# Patient Record
Sex: Male | Born: 1965 | Race: Black or African American | Hispanic: No | Marital: Single | Smoking: Current every day smoker
Health system: Southern US, Community
[De-identification: ages and names within clinical notes are randomized; demographics above are authoritative.]

## PROBLEM LIST (undated history)

## (undated) ENCOUNTER — Ambulatory Visit (HOSPITAL_COMMUNITY): Admission: EM | Payer: Self-pay | Source: Home / Self Care

## (undated) DIAGNOSIS — H5440 Blindness, one eye, unspecified eye: Secondary | ICD-10-CM

## (undated) DIAGNOSIS — G459 Transient cerebral ischemic attack, unspecified: Secondary | ICD-10-CM

## (undated) DIAGNOSIS — M25519 Pain in unspecified shoulder: Secondary | ICD-10-CM

## (undated) DIAGNOSIS — G8929 Other chronic pain: Secondary | ICD-10-CM

## (undated) HISTORY — DX: Blindness, one eye, unspecified eye: H54.40

## (undated) HISTORY — PX: EYE SURGERY: SHX253

---

## 2012-04-10 ENCOUNTER — Ambulatory Visit (INDEPENDENT_AMBULATORY_CARE_PROVIDER_SITE_OTHER): Payer: PRIVATE HEALTH INSURANCE | Admitting: Family Medicine

## 2012-04-10 ENCOUNTER — Encounter: Payer: Self-pay | Admitting: Family Medicine

## 2012-04-10 VITALS — BP 120/90 | HR 74 | Resp 15 | Ht 77.5 in | Wt 218.0 lb

## 2012-04-10 DIAGNOSIS — M25519 Pain in unspecified shoulder: Secondary | ICD-10-CM

## 2012-04-10 DIAGNOSIS — F17200 Nicotine dependence, unspecified, uncomplicated: Secondary | ICD-10-CM

## 2012-04-10 DIAGNOSIS — Z720 Tobacco use: Secondary | ICD-10-CM

## 2012-04-10 DIAGNOSIS — G8929 Other chronic pain: Secondary | ICD-10-CM | POA: Insufficient documentation

## 2012-04-10 NOTE — Patient Instructions (Signed)
Schedule wellness exam/physical for 6 weeks Do not eat after midnight prior to exam, labs will be needed Bring in your form for work  Work on the smoking !!
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
## 2012-04-10 NOTE — Assessment & Plan Note (Signed)
Counseled on importance of cessation of tobacco use. He does not appear ready to quit. He declines flu shot

## 2012-05-22 ENCOUNTER — Ambulatory Visit (INDEPENDENT_AMBULATORY_CARE_PROVIDER_SITE_OTHER): Payer: PRIVATE HEALTH INSURANCE | Admitting: Family Medicine

## 2012-05-22 ENCOUNTER — Encounter: Payer: Self-pay | Admitting: Family Medicine

## 2012-05-22 VITALS — BP 112/82 | HR 84 | Resp 15 | Ht 77.0 in | Wt 225.0 lb

## 2012-05-22 DIAGNOSIS — G8929 Other chronic pain: Secondary | ICD-10-CM

## 2012-05-22 DIAGNOSIS — Z720 Tobacco use: Secondary | ICD-10-CM

## 2012-05-22 DIAGNOSIS — M25519 Pain in unspecified shoulder: Secondary | ICD-10-CM

## 2012-05-22 DIAGNOSIS — F17200 Nicotine dependence, unspecified, uncomplicated: Secondary | ICD-10-CM

## 2012-05-22 DIAGNOSIS — Z1211 Encounter for screening for malignant neoplasm of colon: Secondary | ICD-10-CM

## 2012-05-22 DIAGNOSIS — Z125 Encounter for screening for malignant neoplasm of prostate: Secondary | ICD-10-CM

## 2012-05-22 DIAGNOSIS — Z13220 Encounter for screening for lipoid disorders: Secondary | ICD-10-CM

## 2012-05-22 DIAGNOSIS — Z0000 Encounter for general adult medical examination without abnormal findings: Secondary | ICD-10-CM

## 2012-05-22 LAB — COMPREHENSIVE METABOLIC PANEL
Albumin: 4.5 g/dL (ref 3.5–5.2)
CO2: 24 mEq/L (ref 19–32)
Glucose, Bld: 90 mg/dL (ref 70–99)
Potassium: 4.5 mEq/L (ref 3.5–5.3)
Sodium: 141 mEq/L (ref 135–145)
Total Bilirubin: 0.6 mg/dL (ref 0.3–1.2)
Total Protein: 7.3 g/dL (ref 6.0–8.3)

## 2012-05-22 LAB — CBC
Platelets: 245 10*3/uL (ref 150–400)
RBC: 5.16 MIL/uL (ref 4.22–5.81)
WBC: 4.5 10*3/uL (ref 4.0–10.5)

## 2012-05-22 LAB — LIPID PANEL
Cholesterol: 156 mg/dL (ref 0–200)
HDL: 49 mg/dL (ref >39)
Total CHOL/HDL Ratio: 3.2 Ratio
Triglycerides: 152 mg/dL — ABNORMAL HIGH (ref <150)

## 2012-05-22 MED ORDER — TRAMADOL HCL 50 MG PO TABS: 50.0000 mg | ORAL_TABLET | Freq: Four times a day (QID) | ORAL | Status: AC | PRN

## 2012-05-22 MED ORDER — IBUPROFEN 800 MG PO TABS: 800.0000 mg | ORAL_TABLET | Freq: Three times a day (TID) | ORAL | Status: DC | PRN

## 2012-05-22 NOTE — Assessment & Plan Note (Signed)
Contemplating cessation. 

## 2012-05-22 NOTE — Patient Instructions (Signed)
I recommend eye visit once a year I recommend dental visit every 6 months Goal is to  Exercise 30 minutes 5 days a week We will send a letter with lab results  I recommend you quit smoking! F/U 1 year or as needed

## 2012-05-22 NOTE — Progress Notes (Signed)
  Subjective:    Patient ID: Julian Rogers, male    DOB: [birth year], 46 y.o.   MRN: [id number]  HPI Patient here for complete physical. He's fasting to have labs done. He continues to have his chronic shoulder pain on and off and is asking for prescription for ibuprofen. No new concerns. Declines immunizations.   Review of Systems  GEN- denies fatigue, fever, weight loss,weakness, recent illness HEENT- denies eye drainage, change in vision, nasal discharge, CVS- denies chest pain, palpitations RESP- denies SOB, cough, wheeze ABD- denies N/V, change in stools, abd pain GU- denies dysuria, hematuria, dribbling, incontinence MSK-+ joint pain, muscle aches, injury Neuro- denies headache, dizziness, syncope, seizure activity      Objective:   Physical Exam GEN- NAD, alert and oriented x3 HEENT- Blind right eye, EOMI, non injected sclera, pink conjunctiva, MMM, oropharynx clear, TM clear bilat Neck- Supple,  CVS- RRR, no murmur RESP-CTAB ABS-NABS,soft,NT,ND GU- prostate smooth, no nodules, FOBT neg, normal rectal tone EXT- No edema Pulses- Radial, DP- 2+ MSK- Decreased ROM left shoulder, +empty can, biceps in tact bilat        Assessment & Plan:   CPE- physical done, work form completed. Declines flu shot   Fasting labs to be done, discussed PSA screening, pt agrees

## 2012-05-22 NOTE — Addendum Note (Signed)
Addended by: Abner Greenspan on: 05/22/2012 04:06 PM   Modules accepted: Orders

## 2012-05-22 NOTE — Assessment & Plan Note (Signed)
Ibuprofen 800mg  given, ultram prn severe pain

## 2014-02-07 ENCOUNTER — Encounter (HOSPITAL_COMMUNITY): Payer: Self-pay | Admitting: Emergency Medicine

## 2014-02-07 ENCOUNTER — Emergency Department (HOSPITAL_COMMUNITY)
Admission: EM | Admit: 2014-02-07 | Discharge: 2014-02-07 | Disposition: A | Discharge status: Home / Self Care | Payer: Self-pay | Attending: Emergency Medicine | Admitting: Emergency Medicine

## 2014-02-07 ENCOUNTER — Emergency Department (HOSPITAL_COMMUNITY): Payer: Self-pay

## 2014-02-07 DIAGNOSIS — R209 Unspecified disturbances of skin sensation: Secondary | ICD-10-CM | POA: Insufficient documentation

## 2014-02-07 DIAGNOSIS — Z880 Allergy status to penicillin: Secondary | ICD-10-CM | POA: Insufficient documentation

## 2014-02-07 DIAGNOSIS — F17200 Nicotine dependence, unspecified, uncomplicated: Secondary | ICD-10-CM | POA: Insufficient documentation

## 2014-02-07 DIAGNOSIS — Z791 Long term (current) use of non-steroidal anti-inflammatories (NSAID): Secondary | ICD-10-CM | POA: Insufficient documentation

## 2014-02-07 DIAGNOSIS — Z8669 Personal history of other diseases of the nervous system and sense organs: Secondary | ICD-10-CM | POA: Insufficient documentation

## 2014-02-07 DIAGNOSIS — M5412 Radiculopathy, cervical region: Secondary | ICD-10-CM | POA: Insufficient documentation

## 2014-02-07 DIAGNOSIS — M25519 Pain in unspecified shoulder: Secondary | ICD-10-CM | POA: Insufficient documentation

## 2014-02-07 HISTORY — DX: Other chronic pain: G89.29

## 2014-02-07 HISTORY — DX: Pain in unspecified shoulder: M25.519

## 2014-02-07 MED ORDER — HYDROCODONE-ACETAMINOPHEN 5-325 MG PO TABS: 1.0000 | ORAL_TABLET | ORAL | Status: DC | PRN

## 2014-02-07 MED ORDER — PREDNISONE 50 MG PO TABS
60.0000 mg | ORAL_TABLET | Freq: Once | ORAL | Status: AC
  Administered 2014-02-07: 60 mg via ORAL
  Filled 2014-02-07 (×2): qty 1

## 2014-02-07 MED ORDER — NAPROXEN 500 MG PO TABS: 500.0000 mg | ORAL_TABLET | Freq: Two times a day (BID) | ORAL | Status: DC

## 2014-02-07 MED ORDER — HYDROCODONE-ACETAMINOPHEN 5-325 MG PO TABS
1.0000 | ORAL_TABLET | Freq: Once | ORAL | Status: AC
  Administered 2014-02-07: 1 via ORAL
  Filled 2014-02-07: qty 1

## 2014-02-07 MED ORDER — METHYLPREDNISOLONE 4 MG PO KIT: PACK | ORAL | Status: DC

## 2014-02-07 NOTE — ED Notes (Signed)
C/o pain to right arm with numbness, rates pain 2 at this time.  Started new job last week and using right arm moore than normal.  Took ibuprofen 800 mg at 0900 this am.
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
## 2014-02-07 NOTE — ED Notes (Addendum)
Patient reports swelling and numbness in right arm that started yesterday. Patient reports feeling "lightheaded" today. Patient reports weakness in right arm. Denies any headaches, slurred speech, or facial drooping.

## 2014-02-07 NOTE — ED Notes (Signed)
Pt had MRI prior to discharge per doctor order.  MRI reviewed and discharge orders given and reviewed with patient prior to leaving ED. Both patient and wife stated understanding of discharge instructions and prescription.

## 2014-02-07 NOTE — Discharge Instructions (Signed)

## 2014-02-07 NOTE — ED Notes (Addendum)
Dr. James at bedside  

## 2014-02-21 ENCOUNTER — Other Ambulatory Visit: Payer: Self-pay | Admitting: Family

## 2014-12-01 ENCOUNTER — Emergency Department (HOSPITAL_COMMUNITY)
Admission: EM | Admit: 2014-12-01 | Discharge: 2014-12-02 | Disposition: A | Discharge status: Home / Self Care | Payer: BLUE CROSS/BLUE SHIELD | Attending: Emergency Medicine | Admitting: Emergency Medicine

## 2014-12-01 ENCOUNTER — Encounter (HOSPITAL_COMMUNITY): Payer: Self-pay

## 2014-12-01 ENCOUNTER — Emergency Department (HOSPITAL_COMMUNITY): Payer: BLUE CROSS/BLUE SHIELD

## 2014-12-01 DIAGNOSIS — Z791 Long term (current) use of non-steroidal anti-inflammatories (NSAID): Secondary | ICD-10-CM | POA: Diagnosis not present

## 2014-12-01 DIAGNOSIS — Y998 Other external cause status: Secondary | ICD-10-CM | POA: Insufficient documentation

## 2014-12-01 DIAGNOSIS — Y9389 Activity, other specified: Secondary | ICD-10-CM | POA: Diagnosis not present

## 2014-12-01 DIAGNOSIS — Z79899 Other long term (current) drug therapy: Secondary | ICD-10-CM | POA: Insufficient documentation

## 2014-12-01 DIAGNOSIS — Z720 Tobacco use: Secondary | ICD-10-CM | POA: Diagnosis not present

## 2014-12-01 DIAGNOSIS — S3991XA Unspecified injury of abdomen, initial encounter: Secondary | ICD-10-CM | POA: Diagnosis not present

## 2014-12-01 DIAGNOSIS — S299XXA Unspecified injury of thorax, initial encounter: Secondary | ICD-10-CM | POA: Diagnosis not present

## 2014-12-01 DIAGNOSIS — Y9289 Other specified places as the place of occurrence of the external cause: Secondary | ICD-10-CM | POA: Diagnosis not present

## 2014-12-01 DIAGNOSIS — W228XXA Striking against or struck by other objects, initial encounter: Secondary | ICD-10-CM | POA: Insufficient documentation

## 2014-12-01 DIAGNOSIS — H5441 Blindness, right eye, normal vision left eye: Secondary | ICD-10-CM | POA: Diagnosis not present

## 2014-12-01 DIAGNOSIS — G8929 Other chronic pain: Secondary | ICD-10-CM | POA: Diagnosis not present

## 2014-12-01 DIAGNOSIS — T1490XA Injury, unspecified, initial encounter: Secondary | ICD-10-CM

## 2014-12-01 DIAGNOSIS — Z880 Allergy status to penicillin: Secondary | ICD-10-CM | POA: Insufficient documentation

## 2014-12-01 LAB — CBC WITH DIFFERENTIAL/PLATELET
BASOS ABS: 0 10*3/uL (ref 0.0–0.1)
BASOS PCT: 0 % (ref 0–1)
EOS ABS: 0.5 10*3/uL (ref 0.0–0.7)
Eosinophils Relative: 9 % — ABNORMAL HIGH (ref 0–5)
HEMATOCRIT: 43.5 % (ref 39.0–52.0)
Hemoglobin: 14.8 g/dL (ref 13.0–17.0)
LYMPHS ABS: 2.5 10*3/uL (ref 0.7–4.0)
LYMPHS PCT: 41 % (ref 12–46)
MCH: 29.5 pg (ref 26.0–34.0)
MCHC: 34 g/dL (ref 30.0–36.0)
MCV: 86.7 fL (ref 78.0–100.0)
Monocytes Absolute: 0.4 10*3/uL (ref 0.1–1.0)
Monocytes Relative: 7 % (ref 3–12)
Neutro Abs: 2.7 10*3/uL (ref 1.7–7.7)
Neutrophils Relative %: 43 % (ref 43–77)
Platelets: 271 10*3/uL (ref 150–400)
RBC: 5.02 MIL/uL (ref 4.22–5.81)
RDW: 14 % (ref 11.5–15.5)
WBC: 6.1 10*3/uL (ref 4.0–10.5)

## 2014-12-01 LAB — BASIC METABOLIC PANEL
ANION GAP: 12 (ref 5–15)
BUN: 11 mg/dL (ref 6–20)
CALCIUM: 9.2 mg/dL (ref 8.9–10.3)
CO2: 24 mmol/L (ref 22–32)
CREATININE: 1.2 mg/dL (ref 0.61–1.24)
Chloride: 100 mmol/L — ABNORMAL LOW (ref 101–111)
GFR calc Af Amer: >60 mL/min (ref >60)
GFR calc non Af Amer: >60 mL/min (ref >60)
Glucose, Bld: 95 mg/dL (ref 65–99)
Potassium: 3.2 mmol/L — ABNORMAL LOW (ref 3.5–5.1)
SODIUM: 136 mmol/L (ref 135–145)

## 2014-12-01 MED ORDER — IOHEXOL 300 MG/ML  SOLN
100.0000 mL | Freq: Once | INTRAMUSCULAR | Status: AC | PRN
  Administered 2014-12-01: 100 mL via INTRAVENOUS

## 2014-12-01 NOTE — ED Notes (Signed)
Pt on cell phone
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
## 2014-12-02 MED ORDER — DICLOFENAC SODIUM 50 MG PO TBEC: 50.0000 mg | DELAYED_RELEASE_TABLET | Freq: Two times a day (BID) | ORAL | Status: DC

## 2014-12-02 NOTE — Discharge Instructions (Signed)
Blunt Chest Trauma °Blunt chest trauma is an injury caused by a blow to the chest. These chest injuries can be very painful. Blunt chest trauma often results in bruised or broken (fractured) ribs. Most cases of bruised and fractured ribs from blunt chest traumas get better after 1 to 3 weeks of rest and pain medicine. Often, the soft tissue in the chest wall is also injured, causing pain and bruising. Internal organs, such as the heart and lungs, may also be injured. Blunt chest trauma can lead to serious medical problems. This injury requires immediate medical care. °CAUSES  °· Motor vehicle collisions. °· Falls. °· Physical violence. °· Sports injuries. °SYMPTOMS  °· Chest pain. The pain may be worse when you move or breathe deeply. °· Shortness of breath. °· Lightheadedness. °· Bruising. °· Tenderness. °· Swelling. °DIAGNOSIS  °Your caregiver will do a physical exam. X-rays may be taken to look for fractures. However, minor rib fractures may not show up on X-rays until a few days after the injury. If a more serious injury is suspected, further imaging tests may be done. This may include ultrasounds, computed tomography (CT) scans, or magnetic resonance imaging (MRI). °TREATMENT  °Treatment depends on the severity of your injury. Your caregiver may prescribe pain medicines and deep breathing exercises. °HOME CARE INSTRUCTIONS °· Limit your activities until you can move around without much pain. °· Do not do any strenuous work until your injury is healed. °· Put ice on the injured area. °¨ Put ice in a plastic bag. °¨ Place a towel between your skin and the bag. °¨ Leave the ice on for 15-20 minutes, 03-04 times a day. °· You may wear a rib belt as directed by your caregiver to reduce pain. °· Practice deep breathing as directed by your caregiver to keep your lungs clear. °· Only take over-the-counter or prescription medicines for pain, fever, or discomfort as directed by your caregiver. °SEEK IMMEDIATE MEDICAL  CARE IF:  °· You have increasing pain or shortness of breath. °· You cough up blood. °· You have nausea, vomiting, or abdominal pain. °· You have a fever. °· You feel dizzy, weak, or you faint. °MAKE SURE YOU: °· Understand these instructions. °· Will watch your condition. °· Will get help right away if you are not doing well or get worse. °Document Released: 08/01/2004 Document Revised: 09/16/2011 Document Reviewed: 04/10/2011 °[employer]® Patient Information ©2015 [employer]. This information is not intended to replace advice given to you by your health care provider. Make sure you discuss any questions you have with your health care provider. ° ° °

## 2016-01-23 ENCOUNTER — Emergency Department (HOSPITAL_COMMUNITY)
Admission: EM | Admit: 2016-01-23 | Discharge: 2016-01-23 | Disposition: A | Discharge status: Home / Self Care | Payer: BLUE CROSS/BLUE SHIELD | Attending: Emergency Medicine | Admitting: Emergency Medicine

## 2016-01-23 ENCOUNTER — Encounter (HOSPITAL_COMMUNITY): Payer: Self-pay

## 2016-01-23 ENCOUNTER — Emergency Department (HOSPITAL_COMMUNITY): Payer: BLUE CROSS/BLUE SHIELD

## 2016-01-23 DIAGNOSIS — F17210 Nicotine dependence, cigarettes, uncomplicated: Secondary | ICD-10-CM | POA: Diagnosis not present

## 2016-01-23 DIAGNOSIS — Z79899 Other long term (current) drug therapy: Secondary | ICD-10-CM | POA: Insufficient documentation

## 2016-01-23 DIAGNOSIS — W19XXXA Unspecified fall, initial encounter: Secondary | ICD-10-CM

## 2016-01-23 DIAGNOSIS — G8929 Other chronic pain: Secondary | ICD-10-CM | POA: Diagnosis not present

## 2016-01-23 DIAGNOSIS — M5412 Radiculopathy, cervical region: Secondary | ICD-10-CM | POA: Diagnosis not present

## 2016-01-23 DIAGNOSIS — M542 Cervicalgia: Secondary | ICD-10-CM

## 2016-01-23 DIAGNOSIS — M546 Pain in thoracic spine: Secondary | ICD-10-CM | POA: Diagnosis present

## 2016-01-23 MED ORDER — CYCLOBENZAPRINE HCL 10 MG PO TABS: 10.0000 mg | ORAL_TABLET | Freq: Three times a day (TID) | ORAL | Status: DC | PRN

## 2016-01-23 MED ORDER — HYDROCODONE-ACETAMINOPHEN 5-325 MG PO TABS
1.0000 | ORAL_TABLET | ORAL | Status: DC | PRN
Start: 2016-01-23 — End: 2021-03-01

## 2016-01-23 MED ORDER — METHYLPREDNISOLONE 4 MG PO TBPK: ORAL_TABLET | ORAL | Status: DC

## 2016-01-23 MED ORDER — OXYCODONE-ACETAMINOPHEN 5-325 MG PO TABS
2.0000 | ORAL_TABLET | Freq: Once | ORAL | Status: AC
  Administered 2016-01-23: 2 via ORAL
  Filled 2016-01-23: qty 2

## 2016-01-23 MED ORDER — KETOROLAC TROMETHAMINE 60 MG/2ML IM SOLN
60.0000 mg | Freq: Once | INTRAMUSCULAR | Status: AC
  Administered 2016-01-23: 60 mg via INTRAMUSCULAR
  Filled 2016-01-23: qty 2

## 2016-01-23 MED ORDER — NAPROXEN 500 MG PO TABS
500.0000 mg | ORAL_TABLET | Freq: Two times a day (BID) | ORAL | Status: DC
Start: 2016-01-23 — End: 2021-03-01

## 2016-01-23 NOTE — ED Notes (Signed)
Fell onto back last week , no LOC no hitting of head C/O upper back pain

## 2016-01-23 NOTE — ED Notes (Signed)
Pt indicated pain to mid back from a fall he had a few days ago. Denies other symptoms

## 2016-01-23 NOTE — ED Provider Notes (Signed)
CSN: [id number]     Arrival date & time 01/23/16  1157 History   First MD Initiated Contact with Patient 01/23/16 1227     Chief Complaint  Patient presents with  . Back Pain     (Consider location/radiation/quality/duration/timing/severity/associated sxs/prior Treatment) Patient is a 50 y.o. male presenting with back pain.  Back Pain Location:  Thoracic spine Quality:  Aching and stiffness Stiffness is present:  All day Radiates to:  Does not radiate Pain severity:  Severe Onset quality:  Sudden Duration:  2 days Timing:  Constant Progression:  Worsening Chronicity:  New Context comment:  Patient tripped on a log and fell backwards, striking his neck and upper spine Relieved by:  Nothing Worsened by:  Movement, standing and palpation Ineffective treatments:  None tried Associated symptoms: no abdominal pain, no bladder incontinence, no bowel incontinence, no chest pain, no dysuria, no fever, no numbness, no paresthesias, no perianal numbness and no weakness     Past Medical History  Diagnosis Date  . 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 Chronic shoulder pain    Past Surgical History  Procedure Laterality Date  . Eye surgery      right eye at birth    Family History  Problem Relation Age of Onset  . Cancer Mother    Social History  Substance Use Topics  . Smoking status: Current Every Day Smoker -- 0.50 packs/day for 25 years    Types: Cigarettes  . Smokeless tobacco: Never Used  . Alcohol Use: Yes     Comment: 3-40 ounces    Review of Systems  Constitutional: Negative for fever.  HENT: Negative for congestion and rhinorrhea.   Eyes: Negative for visual disturbance.  Respiratory: Negative for cough and shortness of breath.   Cardiovascular: Negative for chest pain and leg swelling.  Gastrointestinal: Negative for nausea, vomiting, abdominal pain, diarrhea and bowel incontinence.  Genitourinary: Negative for bladder incontinence,  dysuria and flank pain.  Musculoskeletal: Positive for back pain. Negative for neck stiffness.  Skin: Negative for rash.  Allergic/Immunologic: Negative for immunocompromised state.  Neurological: Negative for syncope, weakness, numbness and paresthesias.      Allergies  Penicillins  Home Medications   Prior to Admission medications   Medication Sig Start Date End Date Taking? Authorizing Provider  diclofenac (VOLTAREN) 50 MG EC tablet Take 1 tablet (50 mg total) by mouth 2 (two) times daily. 12/02/14  Yes Gilda Crease, MD  HYDROcodone-acetaminophen (NORCO/VICODIN) 5-325 MG per tablet Take 1 tablet by mouth every 4 (four) hours as needed. 02/07/14  Yes Rolland Porter, MD  methylPREDNISolone (MEDROL DOSEPAK) 4 MG tablet 6 po on day 1, decrease by 1 tab per day 02/07/14  Yes Rolland Porter, MD  naproxen (NAPROSYN) 500 MG tablet Take 1 tablet (500 mg total) by mouth 2 (two) times daily. 02/07/14  Yes Rolland Porter, MD   BP 130/75 mmHg  Pulse 79  Temp(Src) 98.2 F (36.8 C) (Oral)  Resp 18  Ht  (1.956 m)  Wt 210 lb (95.255 kg)  BMI 24.90 kg/m2  SpO2 99% Physical Exam  Constitutional: He appears well-developed and well-nourished. No distress.  HENT:  Head: Normocephalic.  Mouth/Throat: No oropharyngeal exudate.  Eyes: Conjunctivae are normal. Pupils are equal, round, and reactive to light.  Neck: Normal range of motion. Neck supple.  Cardiovascular: Normal rate, regular rhythm, normal heart sounds and intact distal pulses.  Exam reveals no friction rub.   No  murmur heard. Pulmonary/Chest: Effort normal and breath sounds normal. No respiratory distress. He has no wheezes. He has no rales.  Abdominal: Soft. He exhibits no distension. There is no tenderness.  Musculoskeletal: He exhibits no edema.  Neurological: He is alert.  Skin: Skin is warm. No rash noted.  Nursing note and vitals reviewed.   Spine: Moderate TTP over midline and paraspinal lower cervical and thoracic spine,  with no swelling Strength 5/5 with hand flexion, extension, intrinsic hands muscles, wrist flexion/extension, elbow flexion/extension, and shoulder abduction/adduction/flexion/extension bilaterally Normal, intact sensation to pinprick and light touch in bilateral distal and proximal UE Strength 5/5 throughout LE and sensation intact LE bilaterally Normal gait Normal brachial and brachioradialis reflexes  ED Course  Procedures (including critical care time) Labs Review Labs Reviewed - No data to display  Imaging Review Ct Cervical Spine Wo Contrast  01/23/2016  CLINICAL DATA:  Fall 5 days ago, posterior and upper back pain, bilateral arm tingling. EXAM: CT CERVICAL SPINE WITHOUT CONTRAST TECHNIQUE: Multidetector CT imaging of the cervical spine was performed without intravenous contrast. Multiplanar CT image reconstructions were also generated. COMPARISON:  MRI of the cervical spine February 07, 2014 FINDINGS: ALIGNMENT: Cervical vertebral bodies and alignment. Maintenance of cervical lordosis. OSSEOUS STRUCTURES: Cervical vertebral bodies and posterior elements are intact. Moderate C3-4, mild C4-5 and C6-7 disc height loss associated with uncovertebral hypertrophy and endplate sclerosis consistent with degenerative discs. C1-2 articulation maintained. No destructive bony lesions. Severe LEFT C3-4, moderate to severe bilateral C4-5 and LEFT C5-6 neural foraminal narrowing. Moderate to severe bilateral C6-7 neural foraminal narrowing. SOFT TISSUES: Included prevertebral and paraspinal soft tissues are normal. Mild biapical bullous changes. IMPRESSION: No acute fracture or malalignment. 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 Electronically Signed   By: Awilda Metroourtnay  Bloomer M.D.   On: 01/23/2016 14:33   I have personally reviewed and evaluated these images and lab results as part of my medical decision-making.   EKG Interpretation None       MDM  50 yo AAM with PMHx of cervical radiculopathy who p/w upper thoracic and lower cervical spine pain s/p fall from standing. Pt did c/o intermittent shooting, neuropathic pains in bilateral RUE>LUE but admits these are acute on chronic with no new distribution, weakness, or numbness. CT C-spine and T-Spine show normal alignment and no acute fx, though pt does have significant degenerative and foraminal degenerative disease. No signs of acute neurological compromise in b/l UE and LE at this time. No red flags or signs of cauda equina. Pt o/w well appearing and ambulatory w/o difficulty. No hand weakness, numbness, or signs of central cord syndrome. Will advise NSAIDs, muscle relaxants, and give short course of steroids with PCP and Spine f/u as outpt. Pt in agreement. Return precautions given.  Clinical Impression: 1. Chronic neck pain   2. Chronic radicular cervical pain   3. Fall, initial encounter     Disposition: Discharge  Condition: Good  I have discussed the results, Dx and Tx plan with the pt(& family if present). He/she/they expressed understanding and agree(s) with the plan. Discharge instructions discussed at great length. Strict return precautions discussed and pt &/or family have verbalized understanding of the instructions. No further questions at time of discharge.    Discharge Medication List as of 01/23/2016  2:56 PM    START taking these medications   Details  cyclobenzaprine (FLEXERIL) 10 MG tablet Take 1 tablet (10 mg total) by mouth 3 (three) times  daily as needed for muscle spasms., Starting 01/23/2016, Until Discontinued, Print    methylPREDNISolone (MEDROL DOSEPAK) 4 MG TBPK tablet Take as outlined on packet, Print        Follow Up: Salley Scarlet, MD [address] [place] [address] [phone]   As needed, If symptoms worsen      Shaune Pollack, MD 01/23/16 [phone]

## 2016-01-23 NOTE — Discharge Instructions (Signed)

## 2016-01-26 ENCOUNTER — Encounter (HOSPITAL_COMMUNITY): Payer: Self-pay | Admitting: *Deleted

## 2016-01-26 ENCOUNTER — Emergency Department (HOSPITAL_COMMUNITY)
Admission: EM | Admit: 2016-01-26 | Discharge: 2016-01-26 | Disposition: A | Discharge status: Home / Self Care | Payer: BLUE CROSS/BLUE SHIELD | Attending: Emergency Medicine | Admitting: Emergency Medicine

## 2016-01-26 ENCOUNTER — Ambulatory Visit: Payer: Self-pay | Admitting: Family Medicine

## 2016-01-26 DIAGNOSIS — Z79891 Long term (current) use of opiate analgesic: Secondary | ICD-10-CM | POA: Diagnosis not present

## 2016-01-26 DIAGNOSIS — Z79899 Other long term (current) drug therapy: Secondary | ICD-10-CM | POA: Diagnosis not present

## 2016-01-26 DIAGNOSIS — F17210 Nicotine dependence, cigarettes, uncomplicated: Secondary | ICD-10-CM | POA: Insufficient documentation

## 2016-01-26 DIAGNOSIS — M546 Pain in thoracic spine: Secondary | ICD-10-CM | POA: Diagnosis present

## 2016-01-26 NOTE — ED Notes (Signed)
Pt came on 01/23/16 for back pain. Pt was taken out of work until yesterday. Pt states when he went back to work his back began working again, states he does heavy lifting. Denies any new injury.

## 2016-01-26 NOTE — ED Provider Notes (Signed)
CSN: [id number]     Arrival date & time 01/26/16  [id number] History   First MD Initiated Contact with Patient 01/26/16 1046     Chief Complaint  Patient presents with  . Back Pain     (Consider location/radiation/quality/duration/timing/severity/associated sxs/prior Treatment) HPI Julian Rogers is a 50 y.o. male who presents to the ED with back pain. He was here 7/18 for mid back pain after he fell and hit his back. He went back to work yesterday and was there for an hour and send home due to the pain. He does heavy lifting at work. He denies any new injury. He denies loss of control of his bladder or bowels, n/v or abdominal pain. No shortness of breath or chest pain. He reports that his supervisor told him to come back to the ED for a work note until next week so he did not get an occurrence on his attendance. Patient reports that the medication is helping but he just hurts when he does the heavy lifting.   Past Medical History  Diagnosis Date  . 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 Chronic shoulder pain    Past Surgical History  Procedure Laterality Date  . Eye surgery      right eye at birth    Family History  Problem Relation Age of Onset  . Cancer Mother    Social History  Substance Use Topics  . Smoking status: Current Every Day Smoker -- 0.50 packs/day for 25 years    Types: Cigarettes  . Smokeless tobacco: Never Used  . Alcohol Use: Yes     Comment: 3-40 ounces    Review of Systems Negative except as stated in HPI   Allergies  Penicillins  Home Medications   Prior to Admission medications   Medication Sig Start Date End Date Taking? Authorizing Provider  cyclobenzaprine (FLEXERIL) 10 MG tablet Take 1 tablet (10 mg total) by mouth 3 (three) times daily as needed for muscle spasms. 01/23/16  Yes Shaune Pollackameron Isaacs, MD  diclofenac (VOLTAREN) 50 MG EC tablet Take 1 tablet (50 mg total) by mouth 2 (two) times daily. 12/02/14  Yes Gilda Creasehristopher J  Pollina, MD  HYDROcodone-acetaminophen (NORCO/VICODIN) 5-325 MG tablet Take 1 tablet by mouth every 4 (four) hours as needed for severe pain. 01/23/16  Yes Shaune Pollackameron Isaacs, MD  methylPREDNISolone (MEDROL DOSEPAK) 4 MG TBPK tablet Take as outlined on packet 01/23/16  Yes Shaune Pollackameron Isaacs, MD  naproxen (NAPROSYN) 500 MG tablet Take 1 tablet (500 mg total) by mouth 2 (two) times daily. 01/23/16  Yes Shaune Pollackameron Isaacs, MD   BP 117/78 mmHg  Pulse 67  Temp(Src) 98.3 F (36.8 C) (Oral)  Resp 16  Ht 6\' 5"  (1.956 m)  Wt 95.255 kg  BMI 24.90 kg/m2  SpO2 99% Physical Exam  Constitutional: He is oriented to person, place, and time. He appears well-developed and well-nourished. No distress.  HENT:  Head: Normocephalic and atraumatic.  Eyes: EOM are normal.  Neck: Neck supple.  Cardiovascular: Normal rate and regular rhythm.   Pulmonary/Chest: Effort normal.  Abdominal: He exhibits no distension.  Musculoskeletal: He exhibits no edema.       Thoracic back: He exhibits tenderness and spasm. He exhibits normal pulse. Decreased range of motion: due to pain.  Neurological: He is alert and oriented to person, place, and time. He has normal strength. No sensory deficit. Gait normal.  Reflex Scores:      Bicep reflexes are 2+  on the right side and 2+ on the left side.      Brachioradialis reflexes are 2+ on the right side and 2+ on the left side.      Patellar reflexes are 2+ on the right side and 2+ on the left side. Skin: Skin is warm and dry.  Psychiatric: He has a normal mood and affect. His behavior is normal.  Nursing note and vitals reviewed.   ED Course  Procedures (including critical care time) Labs Review Labs Reviewed - No data to display   MDM  50 y.o. male with continued back pain after fall 7/18 stable for d/c without focal neuro deficits and improving since the fall. He will continue his medication and f/u with his PCP as planned. He will return for worsening symptoms.  Work note given.    Final diagnoses:  Midline thoracic back pain       Janne Napoleon, NP 01/26/16 1216  Bethann Berkshire, MD 01/27/16 1320

## 2016-04-12 IMAGING — CT CT CHEST W/ CM
2 of 5 series · 14 of 36 positions shown, 17 images · IV contrast (Omnipaque 300)
Comparison: None.

CLINICAL DATA: Right chest injury 2 days ago after tired [place] hit
him in the chest. Abdominal pain. Initial encounter.

EXAM:
CT CHEST, ABDOMEN, AND PELVIS WITH CONTRAST
TECHNIQUE: Multidetector CT imaging of the chest, abdomen and pelvis was
performed following the standard protocol during bolus
administration of intravenous contrast.
CONTRAST:  100mL OMNIPAQUE IOHEXOL 300 MG/ML  SOLN

[Series 2: cap with 5.0 b40f · axial · 0.78mm/px · z∈[-628,-43]mm · 11 of 135 slices shown, 14 images]
[im 9/135  mediastinal]
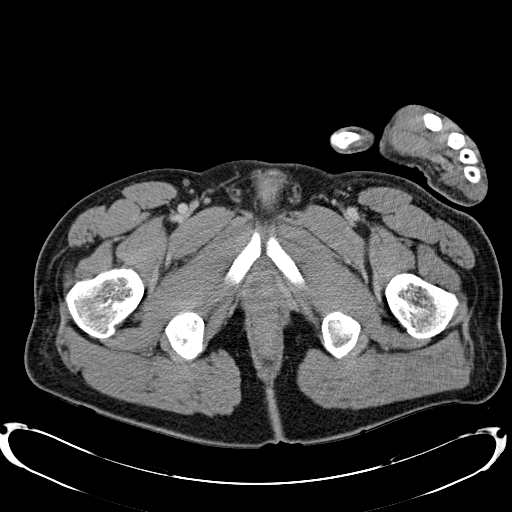
[im 9/135  lung]
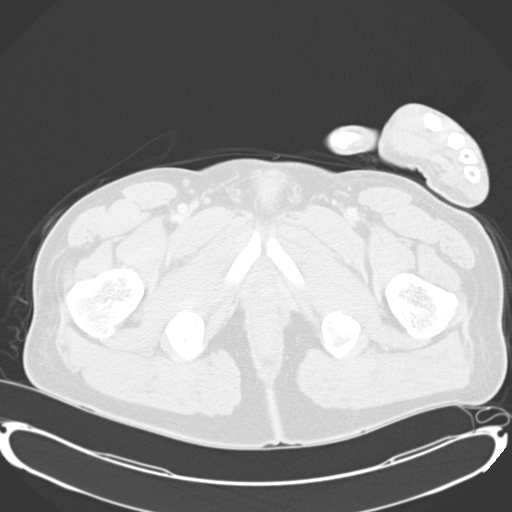
[im 18/135  lung]
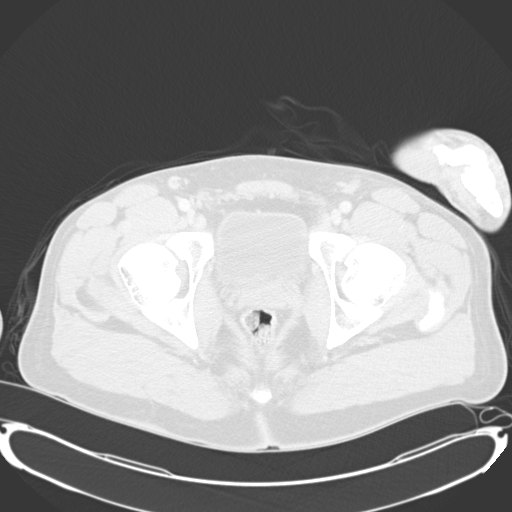
[im 36/135  lung]
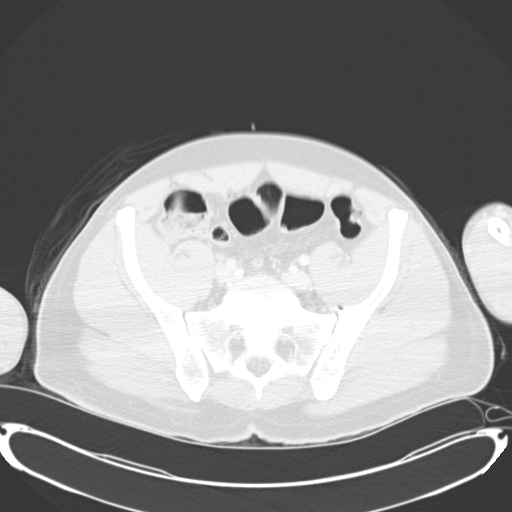
[im 45/135  lung]
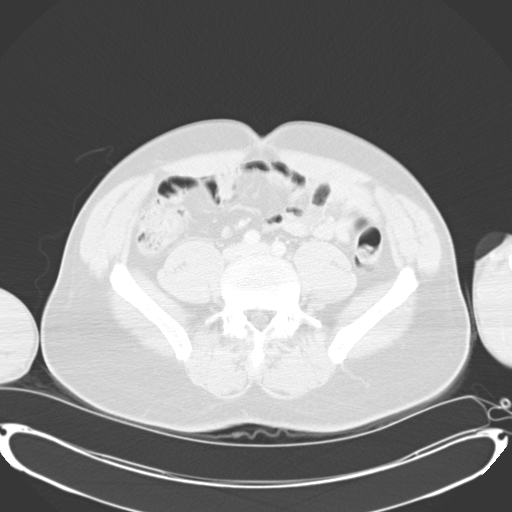
[im 54/135  mediastinal]
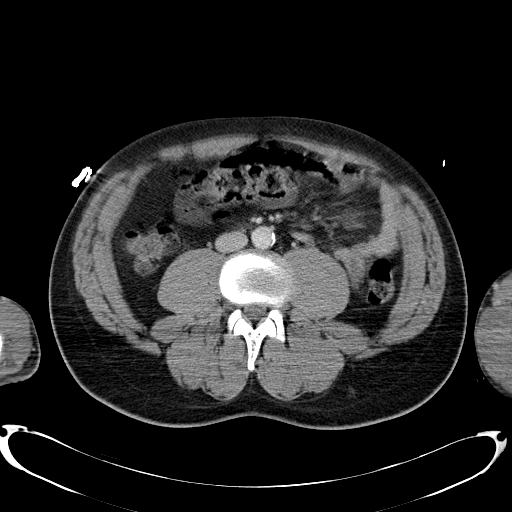
[im 54/135  lung]
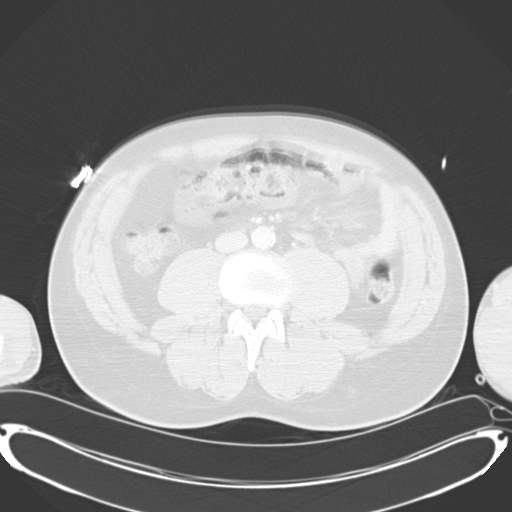
[im 72/135  lung]
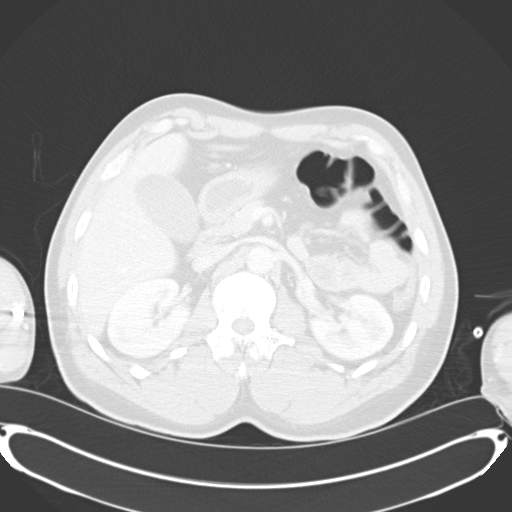
[im 81/135  lung]
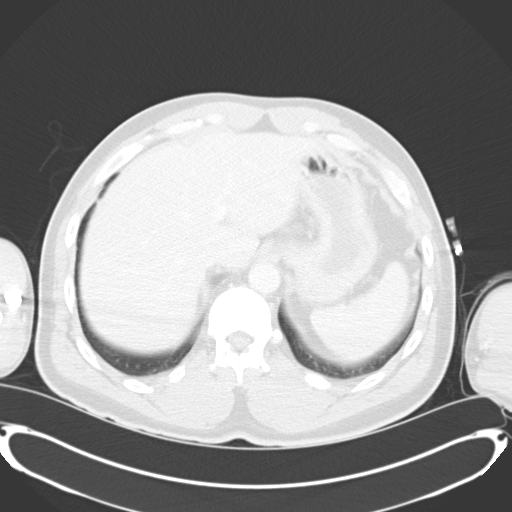
[im 90/135  lung]
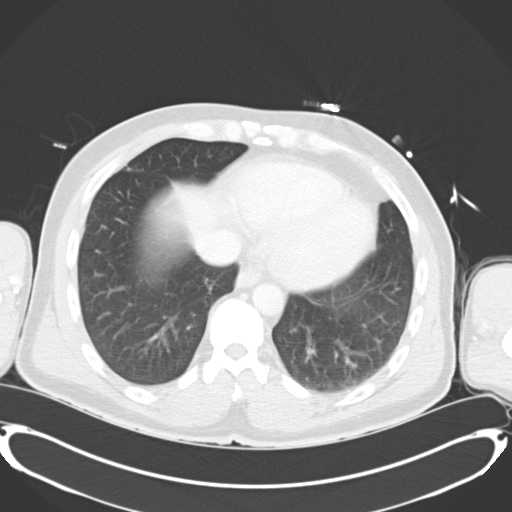
[im 99/135  mediastinal]
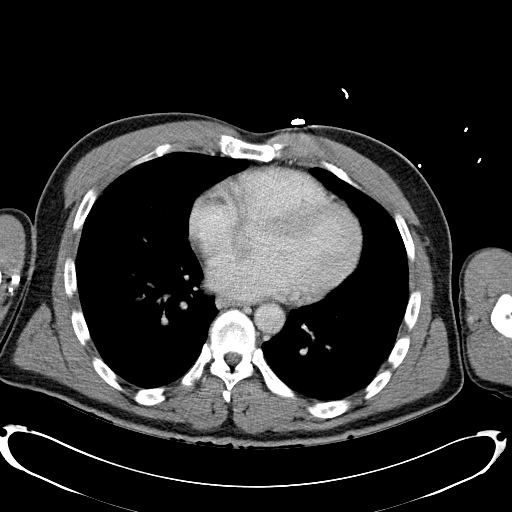
[im 99/135  lung]
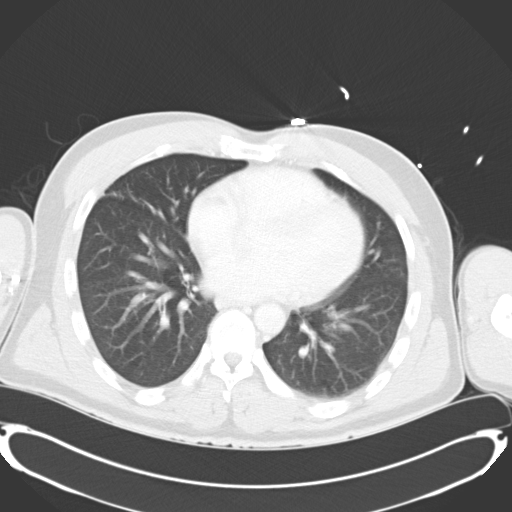
[im 117/135  lung]
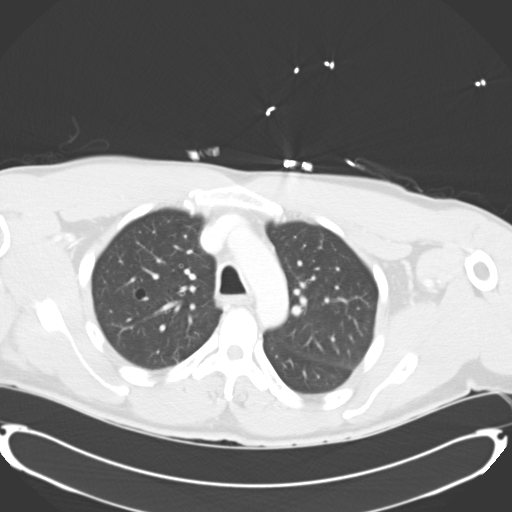
[im 126/135  lung]
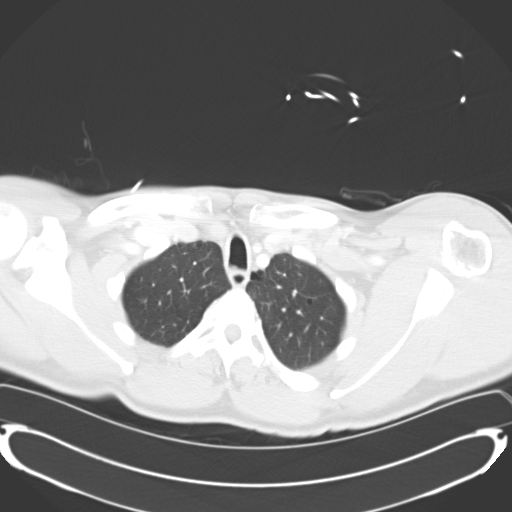

[Series 3: mpr cor post contrast (id) · coronal · 0.83mm/px · 3 of 84 slices shown]
[im 17/84  lung]
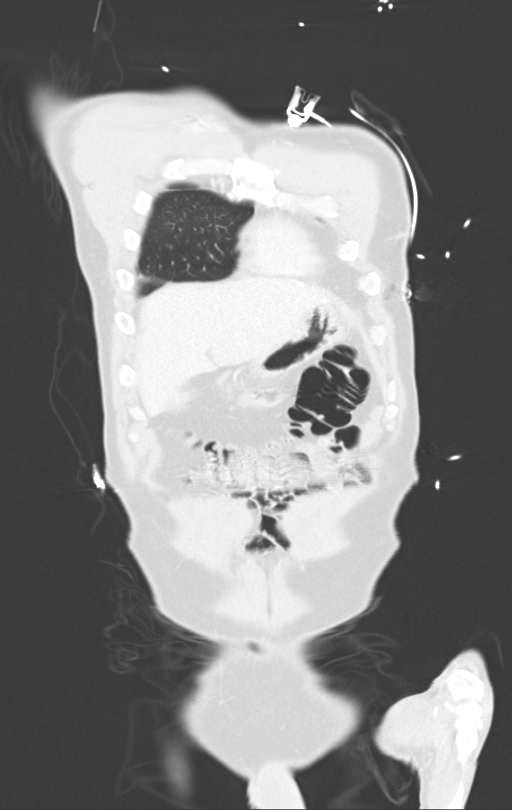
[im 34/84  lung]
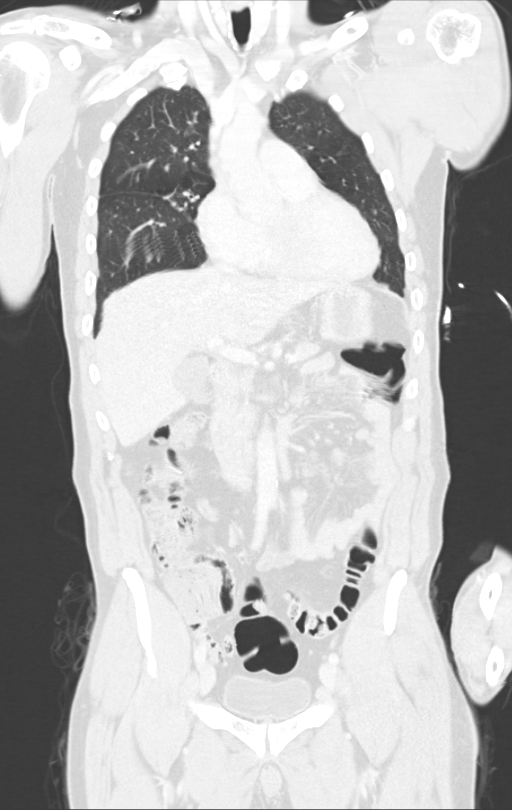
[im 50/84  lung]
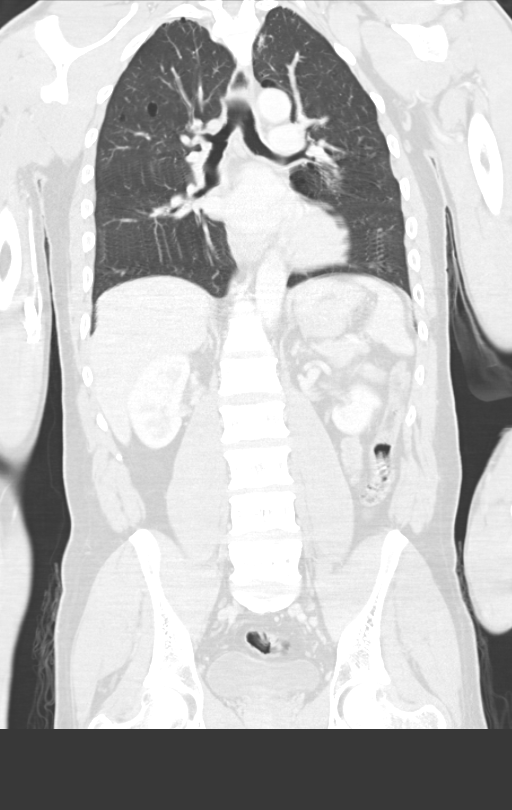

[14 of 36 positions shown; findings below may reference images not displayed]

FINDINGS: CT CHEST FINDINGS

THORACIC INLET/BODY WALL:

No acute abnormality.

MEDIASTINUM:

Normal heart size. No pericardial effusion. No acute vascular
abnormality. No adenopathy.

LUNG WINDOWS:

No contusion, hemothorax, or pneumothorax.

Few emphysematous spaces.

5 mm noncalcified pulmonary nodule in the left upper lobe on image
8. 5 mm left lower lobe pulmonary nodule on image 27. An opacity in
the right upper lobe on image 27 has a scar-like appearance on axial
imaging, but when viewed sagittally and has a more nodular
appearance measuring 8 mm.

OSSEOUS:

See below

CT ABDOMEN AND PELVIS FINDINGS

Sensitivity diminished by intermittent motion.

BODY WALL: Unremarkable.

Liver: No focal abnormality.

Biliary: No evidence of biliary obstruction or stone.

Pancreas: Unremarkable.

Spleen: Unremarkable.

Adrenals: Unremarkable.

Kidneys and ureters: Symmetric enhancement.  No evidence of injury.

Bladder: Unremarkable.

Reproductive: Unremarkable.

Bowel: No evidence of injury

Retroperitoneum: No mass or adenopathy.

Peritoneum: No free fluid or gas.

Vascular: No acute findings.

OSSEOUS: Geographic sclerosis in the left more than right femoral
heads consistent with osteonecrosis. No collapse.
IMPRESSION: 1. No traumatic findings.
2. Pulmonary nodules, up to 8 mm. Given smoking history, follow-up
chest CT at 3-1months is recommended.
3. Bilateral proximal femur avascular necrosis.

## 2021-03-01 ENCOUNTER — Other Ambulatory Visit: Payer: Self-pay

## 2021-03-01 ENCOUNTER — Ambulatory Visit: Payer: Self-pay | Admitting: Physician Assistant

## 2021-03-01 VITALS — BP 122/82 | HR 119 | Temp 98.5°F | Resp 18 | Ht 77.0 in | Wt 202.0 lb

## 2021-03-01 DIAGNOSIS — Z6823 Body mass index (BMI) 23.0-23.9, adult: Secondary | ICD-10-CM

## 2021-03-01 DIAGNOSIS — Z13228 Encounter for screening for other metabolic disorders: Secondary | ICD-10-CM

## 2021-03-01 DIAGNOSIS — U071 COVID-19: Secondary | ICD-10-CM

## 2021-03-01 DIAGNOSIS — R0602 Shortness of breath: Secondary | ICD-10-CM

## 2021-03-01 DIAGNOSIS — Z1159 Encounter for screening for other viral diseases: Secondary | ICD-10-CM

## 2021-03-01 DIAGNOSIS — R000 Tachycardia, unspecified: Secondary | ICD-10-CM | POA: Insufficient documentation

## 2021-03-01 DIAGNOSIS — R059 Cough, unspecified: Secondary | ICD-10-CM

## 2021-03-01 DIAGNOSIS — Z125 Encounter for screening for malignant neoplasm of prostate: Secondary | ICD-10-CM

## 2021-03-01 DIAGNOSIS — Z114 Encounter for screening for human immunodeficiency virus [HIV]: Secondary | ICD-10-CM

## 2021-03-01 DIAGNOSIS — Z13220 Encounter for screening for lipoid disorders: Secondary | ICD-10-CM

## 2021-03-01 DIAGNOSIS — H5440 Blindness, one eye, unspecified eye: Secondary | ICD-10-CM

## 2021-03-01 DIAGNOSIS — Z8616 Personal history of COVID-19: Secondary | ICD-10-CM

## 2021-03-01 DIAGNOSIS — J069 Acute upper respiratory infection, unspecified: Secondary | ICD-10-CM

## 2021-03-01 MED ORDER — AZITHROMYCIN 250 MG PO TABS
ORAL_TABLET | ORAL | 0 refills | Status: DC
  Filled 2021-03-01: qty 6, 5d supply, fill #0

## 2021-03-01 MED ORDER — BENZONATATE 100 MG PO CAPS
200.0000 mg | ORAL_CAPSULE | Freq: Two times a day (BID) | ORAL | 0 refills | Status: DC | PRN
  Filled 2021-03-01: qty 40, 10d supply, fill #0

## 2021-03-01 MED ORDER — CETIRIZINE HCL 10 MG PO TABS
10.0000 mg | ORAL_TABLET | Freq: Every day | ORAL | 11 refills | Status: DC
  Filled 2021-03-01: qty 30, 30d supply, fill #0

## 2021-03-01 NOTE — Progress Notes (Signed)
New Patient Office Visit  Subjective:  Patient ID: Julian Rogers, male    DOB: [birth year]  Age: 55 y.o. MRN: [id number]  CC:  Chief Complaint  Patient presents with   Follow-up    COVID    HPI Julian Rogers reports that he tested positive for COVID on August 11, states that he had been having symptoms for 3 days prior.  Reports that he did go back to work on August 17, states at the time he was feeling better, but does endorse that he was not 100%.  Reports that he works in an environment with a lot of dust and large fans blowing it around.  Reports that the second day back to work he started feeling worse, has been having "congestion in his chest", productive cough with thick dark-colored mucus, unmeasured temperature last night, states he "felt hot".  Reports that he has been having shortness of breath on exertion.  Reports that he only had symptomatic treatment while he was COVID-positive, did not take any prescription medications.  Has had 2 COVID vaccines, has not had any boosters.    Past Medical History:  Diagnosis Date   Blind right eye    Newborn infection resuling in vision loss   Chronic shoulder pain     Past Surgical History:  Procedure Laterality Date   EYE SURGERY     right eye at birth     Family History  Problem Relation Age of Onset   Cancer Mother     Social History   Socioeconomic History   Marital status: Single    Spouse name: Not on file   Number of children: Not on file   Years of education: Not on file   Highest education level: Not on file  Occupational History   Not on file  Tobacco Use   Smoking status: Every Day    Packs/day: 0.50    Years: 25.00    Pack years: 12.50    Types: Cigarettes   Smokeless tobacco: Never  Substance and Sexual Activity   Alcohol use: Yes    Comment: 3-40 ounces   Drug use: No   Sexual activity: Not on file  Other Topics Concern   Not on file  Social History Narrative   Not on file   Social  Determinants of Health   Financial Resource Strain: Not on file  Food Insecurity: Not on file  Transportation Needs: Not on file  Physical Activity: Not on file  Stress: Not on file  Social Connections: Not on file  Intimate Partner Violence: Not on file    ROS Review of Systems  Constitutional:  Negative for chills and fever.  HENT:  Positive for congestion and postnasal drip. Negative for ear pain, sinus pressure, sore throat and trouble swallowing.   Eyes: Negative.   Respiratory:  Positive for cough and shortness of breath. Negative for wheezing.   Cardiovascular:  Negative for chest pain.  Gastrointestinal:  Negative for diarrhea, nausea and vomiting.  Endocrine: Negative.   Genitourinary: Negative.   Musculoskeletal:  Negative for myalgias.  Skin: Negative.   Allergic/Immunologic: Negative.   Neurological: Negative.   Hematological: Negative.   Psychiatric/Behavioral: Negative.     Objective:   Today's Vitals: BP 122/82 (BP Location: Left Arm, Patient Position: Sitting, Cuff Size: Normal)   Pulse (!) 119   Temp 98.5 F (36.9 C) (Oral)   Resp 18   Ht 6\' 5"  (1.956 m)   Wt 202 lb (91.6 kg)  SpO2 98%   BMI 23.95 kg/m   Physical Exam Vitals and nursing note reviewed.  Constitutional:      General: He is not in acute distress.    Appearance: Normal appearance. He is not ill-appearing.  HENT:     Head: Normocephalic and atraumatic.     Salivary Glands: Right salivary gland is diffusely enlarged. Left salivary gland is diffusely enlarged.     Right Ear: Tympanic membrane, ear canal and external ear normal.     Left Ear: Tympanic membrane, ear canal and external ear normal.     Nose:     Right Turbinates: Swollen.     Left Turbinates: Swollen.     Right Sinus: No frontal sinus tenderness.     Left Sinus: No frontal sinus tenderness.     Mouth/Throat:     Lips: Pink.     Mouth: Mucous membranes are moist.     Pharynx: Oropharynx is clear.     Tonsils: No  tonsillar exudate.  Eyes:     Conjunctiva/sclera: Conjunctivae normal.  Cardiovascular:     Rate and Rhythm: Regular rhythm. Tachycardia present.     Pulses: Normal pulses.     Heart sounds: Normal heart sounds.  Pulmonary:     Effort: Pulmonary effort is normal. No respiratory distress.     Breath sounds: Normal breath sounds. No wheezing.  Musculoskeletal:        General: Normal range of motion.     Cervical back: Normal range of motion and neck supple.  Lymphadenopathy:     Cervical: No cervical adenopathy.  Skin:    General: Skin is warm and dry.  Neurological:     General: No focal deficit present.     Mental Status: He is alert and oriented to person, place, and time.  Psychiatric:        Mood and Affect: Mood normal.        Behavior: Behavior normal.        Thought Content: Thought content normal.        Judgment: Judgment normal.    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   BMI 23.0-23.9, adult   Other Visit Diagnoses     Acute upper respiratory infection       Relevant Medications   azithromycin (ZITHROMAX) 250 MG tablet   Cough       Relevant Medications   benzonatate (TESSALON) 100 MG capsule   cetirizine (ZYRTEC) 10 MG tablet   SOB (shortness of breath)       Screening for metabolic disorder       Relevant Orders   Comprehensive metabolic panel   CBC with Differential/Platelet   Screening, lipid       Relevant Orders   Lipid panel   Screening PSA (prostate specific antigen)       Relevant Orders   PSA   Screening for HIV (human immunodeficiency virus)       Encounter for HCV screening test for low risk patient           Outpatient Encounter Medications as of 03/01/2021  Medication Sig   azithromycin (ZITHROMAX) 250 MG tablet Take 2 tablets by mouth on day 1, then take 1  tablet once daily   benzonatate (TESSALON)  100 MG capsule Take 2 capsules (200 mg total) by mouth 2 (two) times daily as needed for cough.   cetirizine (ZYRTEC) 10 MG tablet Take 1 tablet (10 mg total) by mouth daily.   [DISCONTINUED] cyclobenzaprine (FLEXERIL) 10 MG tablet Take 1 tablet (10 mg total) by mouth 3 (three) times daily as needed for muscle spasms.   [DISCONTINUED] diclofenac (VOLTAREN) 50 MG EC tablet Take 1 tablet (50 mg total) by mouth 2 (two) times daily.   [DISCONTINUED] HYDROcodone-acetaminophen (NORCO/VICODIN) 5-325 MG tablet Take 1 tablet by mouth every 4 (four) hours as needed for severe pain.   [DISCONTINUED] methylPREDNISolone (MEDROL DOSEPAK) 4 MG TBPK tablet Take as outlined on packet   [DISCONTINUED] naproxen (NAPROSYN) 500 MG tablet Take 1 tablet (500 mg total) by mouth 2 (two) times daily.   No facility-administered encounter medications on file as of 03/01/2021.   1. History of COVID-19  Patient was given application for [facility] financial assistance, given appointment to establish care at [facility] on April 02, 2021.  Patient to return to mobile unit for fasting labs.  2. Acute upper respiratory infection Trial azithromycin, Tessalon Perles, Zyrtec.  Patient education given on supportive care.  Red flags for prompt reevaluation. - azithromycin (ZITHROMAX) 250 MG tablet; Take 2 tablets by mouth on day 1, then take 1 tablet once daily  Dispense: 6 tablet; Refill: 0  3. Cough  - benzonatate (TESSALON) 100 MG capsule; Take 2 capsules (200 mg total) by mouth 2 (two) times daily as needed for cough.  Dispense: 40 capsule; Refill: 0 - cetirizine (ZYRTEC) 10 MG tablet; Take 1 tablet (10 mg total) by mouth daily.  Dispense: 30 tablet; Refill: 11  4. SOB (shortness of breath)   5. Tachycardia  - TSH; Future  6. 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 Screening for metabolic disorder  - Comprehensive metabolic  panel; Future - CBC with Differential/Platelet; Future  8. Screening, lipid  - Lipid panel; Future  9. Screening PSA (prostate specific antigen)  - PSA; Future  10. Screening for HIV (human immunodeficiency virus)   11. Encounter for HCV screening test for low risk patient   12. BMI 23.0-23.9, adult   I have reviewed the patient's medical history (PMH, PSH, Social History, Family History, Medications, and allergies) , and have been updated if relevant. I spent 32 minutes reviewing chart and  face to face time with patient.    Follow-up: Return in about 1 week (around 03/08/2021) for Fasting  labs.   Kasandra Knudsen Mayers, PA-C

## 2021-03-01 NOTE — Progress Notes (Signed)
Patient was diagnosed on 8/10 with COVID. Patient reports continued fatigue and had to return to work on 8/16.

## 2021-03-01 NOTE — Patient Instructions (Signed)
You are going to take azithromycin, this is an antibiotic to help you with your upper respiratory infection.  You will use Tessalon Perles twice a day as needed to help you with the cough, and I encourage you to use Zyrtec on a daily basis.  I encourage you to get lots of rest, and stay very well-hydrated.  Please return to the mobile unit next week for fasting labs to be completed.  I hope that you feel better soon  Roney Jaffe, PA-C Physician Assistant [facility] [email or web address] Upper Respiratory Infection, Adult An upper respiratory infection (URI) is a common viral infection of the nose, throat, and upper air passages that lead to the lungs. The most common type of URI is the common cold. URIs usually get better on their own, without medicaltreatment. What are the causes? A URI is caused by a virus. You may catch a virus by: Breathing in droplets from an infected person's cough or sneeze. Touching something that has been exposed to the virus (contaminated) and then touching your mouth, nose, or eyes. What increases the risk? You are more likely to get a URI if: You are very young or very old. It is autumn or winter. You have close contact with others, such as at a daycare, school, or health care facility. You smoke. You have long-term (chronic) heart or lung disease. You have a weakened disease-fighting (immune) system. You have nasal allergies or asthma. You are experiencing a lot of stress. You work in an area that has poor air circulation. You have poor nutrition. What are the signs or symptoms? A URI usually involves some of the following symptoms: Runny or stuffy (congested) nose. Sneezing. Cough. Sore throat. Headache. Fatigue. Fever. Loss of appetite. Pain in your forehead, behind your eyes, and over your cheekbones (sinus pain). Muscle aches. Redness or irritation of the eyes. Pressure in the ears or  face. How is this diagnosed? This condition may be diagnosed based on your medical history and symptoms, and a physical exam. Your health care provider may use a cotton swab to take a mucus sample from your nose (nasal swab). This sample can be tested to determine what virus is causing the illness. How is this treated? URIs usually get better on their own within 7-10 days. You can take steps at home to relieve your symptoms. Medicines cannot cure URIs, but your health care provider may recommend certain medicines to help relieve symptoms, such as: Over-the-counter cold medicines. Cough suppressants. Coughing is a type of defense against infection that helps to clear the respiratory system, so take these medicines only as recommended by your health care provider. Fever-reducing medicines. Follow these instructions at home: Activity Rest as needed. If you have a fever, stay home from work or school until your fever is gone or until your health care provider says you are no longer contagious. Your health care provider may have you wear a face mask to prevent your infection from spreading. Relieving symptoms Gargle with a salt-water mixture 3-4 times a day or as needed. To make a salt-water mixture, completely dissolve -1 tsp of salt in 1 cup of warm water. Use a cool-mist humidifier to add moisture to the air. This can help you breathe more easily. Eating and drinking  Drink enough fluid to keep your urine pale yellow. Eat soups and other clear broths.  General instructions  Take over-the-counter and prescription medicines only as told by your health care provider. These include cold medicines, fever  reducers, and cough suppressants. Do not use any products that contain nicotine or tobacco, such as cigarettes and e-cigarettes. If you need help quitting, ask your health care provider. Stay away from secondhand smoke. Stay up to date on all immunizations, including the yearly (annual) flu  vaccine. Keep all follow-up visits as told by your health care provider. This is important.  How to prevent the spread of infection to others  URIs can be passed from person to person (are contagious). To prevent the infection from spreading: Wash your hands often with soap and water. If soap and water are not available, use hand sanitizer. Avoid touching your mouth, face, eyes, or nose. Cough or sneeze into a tissue or your sleeve or elbow instead of into your hand or into the air.  Contact a health care provider if: You are getting worse instead of better. You have a fever or chills. Your mucus is brown or red. You have yellow or brown discharge coming from your nose. You have pain in your face, especially when you bend forward. You have swollen neck glands. You have pain while swallowing. You have white areas in the back of your throat. Get help right away if: You have shortness of breath that gets worse. You have severe or persistent: Headache. Ear pain. Sinus pain. Chest pain. You have chronic lung disease along with any of the following: Wheezing. Prolonged cough. Coughing up blood. A change in your usual mucus. You have a stiff neck. You have changes in your: Vision. Hearing. Thinking. Mood. Summary An upper respiratory infection (URI) is a common infection of the nose, throat, and upper air passages that lead to the lungs. A URI is caused by a virus. URIs usually get better on their own within 7-10 days. Medicines cannot cure URIs, but your health care provider may recommend certain medicines to help relieve symptoms. This information is not intended to replace advice given to you by your health care provider. Make sure you discuss any questions you have with your healthcare provider. Document Revised: 03/02/2020 Document Reviewed: 03/02/2020 Elsevier Patient Education  2022 [employer].

## 2021-03-08 ENCOUNTER — Other Ambulatory Visit: Payer: Self-pay

## 2021-04-02 ENCOUNTER — Ambulatory Visit: Payer: Self-pay | Admitting: Family Medicine

## 2021-06-07 ENCOUNTER — Ambulatory Visit: Payer: Self-pay | Admitting: *Deleted

## 2021-06-07 ENCOUNTER — Ambulatory Visit (HOSPITAL_COMMUNITY)
Admission: EM | Admit: 2021-06-07 | Discharge: 2021-06-07 | Disposition: A | Discharge status: Home / Self Care | Payer: Self-pay | Attending: Emergency Medicine | Admitting: Emergency Medicine

## 2021-06-07 ENCOUNTER — Other Ambulatory Visit: Payer: Self-pay

## 2021-06-07 ENCOUNTER — Encounter (HOSPITAL_COMMUNITY): Payer: Self-pay | Admitting: Emergency Medicine

## 2021-06-07 DIAGNOSIS — J0110 Acute frontal sinusitis, unspecified: Secondary | ICD-10-CM

## 2021-06-07 MED ORDER — DOXYCYCLINE HYCLATE 100 MG PO CAPS: 100.0000 mg | ORAL_CAPSULE | Freq: Two times a day (BID) | ORAL | 0 refills | Status: DC

## 2021-06-07 MED ORDER — PREDNISONE 20 MG PO TABS: 40.0000 mg | ORAL_TABLET | Freq: Every day | ORAL | 0 refills | Status: DC

## 2021-06-07 MED ORDER — ALBUTEROL SULFATE HFA 108 (90 BASE) MCG/ACT IN AERS: 2.0000 | INHALATION_SPRAY | RESPIRATORY_TRACT | 0 refills | Status: DC | PRN

## 2021-06-07 NOTE — Telephone Encounter (Signed)
Reason for Disposition  [1] Nasal discharge AND [2] present > 10 days  Answer Assessment - Initial Assessment Questions 1. ONSET: "When did the cough begin?"      Pt calling in.   I had Covid.   Since Covid I'm coughing up stuff and having shortness of breath.   I had Covid in August.    The cough is not going away.   2. SEVERITY: "How bad is the cough today?"      The cough is worse.   3. SPUTUM: "Describe the color of your sputum" (none, dry cough; clear, white, yellow, green)     Clear.  It's my sinuses are draining and it makes me cough.   Blow my nose it's yellow. 4. HEMOPTYSIS: "Are you coughing up any blood?" If so ask: "How much?" (flecks, streaks, tablespoons, etc.)     No 5. DIFFICULTY BREATHING: "Are you having difficulty breathing?" If Yes, ask: "How bad is it?" (e.g., mild, moderate, severe)    - MILD: No SOB at rest, mild SOB with walking, speaks normally in sentences, can lie down, no retractions, pulse < 100.    - MODERATE: SOB at rest, SOB with minimal exertion and prefers to sit, cannot lie down flat, speaks in phrases, mild retractions, audible wheezing, pulse 100-120.    - SEVERE: Very SOB at rest, speaks in single words, struggling to breathe, sitting hunched forward, retractions, pulse > 120      Shortness of breath since I had Covid in August.     The shortness of breath happens when I'm coughing.   I live in a condo on 3rd floor I get short of breath going up the steps but not walking on level ground to the mailbox. 6. FEVER: "Do you have a fever?" If Yes, ask: "What is your temperature, how was it measured, and when did it start?"     No 7. CARDIAC HISTORY: "Do you have any history of heart disease?" (e.g., heart attack, congestive heart failure)      No 8. LUNG HISTORY: "Do you have any history of lung disease?"  (e.g., pulmonary embolus, asthma, emphysema)     I smoke.   No asthma or emphysema or COPD. 9. PE RISK FACTORS: "Do you have a history of blood clots?" (or:  recent major surgery, recent prolonged travel, bedridden)     No clots in lungs 10. OTHER SYMPTOMS: "Do you have any other symptoms?" (e.g., runny nose, wheezing, chest pain)       Runny nose at times, I have sinus problems.  I have sinus pressure.     No sore throat or ear pain.   I think it's post nasal drip.  Some diarrhea since Covid.  No chest pain/tightness.   No vomiting but sinus drainage makes me feel nauseas from the drainage. 11. PREGNANCY: "Is there any chance you are pregnant?" "When was your last menstrual period?"       N/A 12. TRAVEL: "Have you traveled out of the country in the last month?" (e.g., travel history, exposures)       No travels.   No sick exposures.    I haven't seen Dr. Milinda Antis in many years.   I don't have a PCP. I go to the Mobile Unit with [facility].  Protocols used: Cough - Acute Productive-A-AH

## 2021-06-07 NOTE — Telephone Encounter (Signed)
Pt called in c/o continued coughing and some shortness of breath since having Covid in August.   He is c/o a lot of sinus pressure and congestion with a lot of post nasal drip making him feel nauseas.  He normally goes to the [facility] but it is out of service until further notice.    [facility] is not taking new pts at this time due to moving in Jan. To a new office location.  I referred him to the [facility].   He was agreeable to going there.

## 2021-06-07 NOTE — ED Triage Notes (Signed)
Pt is present today with nasal congestion, sinus pressure, cough, and SOB. Pt states that he noticed his sx flaring up within the last couple weeks

## 2021-06-07 NOTE — ED Provider Notes (Signed)
[facility]    CSN: [id number] Arrival date & time: 06/07/21  1737      History   Chief Complaint Chief Complaint  Patient presents with   Cough   Shortness of Breath    HPI Julian Rogers is a 55 y.o. male.   Patient presents with nasal congestion, sinus pressure and pain, nonproductive cough and increased shortness of breath for the last 2 to 3 weeks.  Endorses that symptoms have been occuring intermittently since August when had COVID virus. Received 2 vaccines.  No known sick contacts.  Denies fever, chills, body aches, vomiting, nausea, sore throat, ear pain, headaches.  Attempted use of over-the-counter medications with no improvement.   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 Authorizing Provider  azithromycin (ZITHROMAX) 250 MG tablet Take 2 tablets by mouth on day 1, then take 1 tablet once daily 03/01/21   Mayers, Cari S, PA-C  benzonatate (TESSALON) 100 MG capsule Take 2 capsules (200 mg total) by mouth 2 (two) times daily as needed for cough. 03/01/21   Mayers, Cari S, PA-C  cetirizine (ZYRTEC) 10 MG tablet Take 1 tablet (10 mg total) by mouth daily. 03/01/21   Mayers, Kasandra Knudsen, PA-C    Family History Family History  Problem Relation Age of Onset   Cancer Mother     Social History Social History   Tobacco Use   Smoking status: Every Day    Packs/day: 0.50    Years: 25.00    Pack  years: 12.50    Types: Cigarettes   Smokeless tobacco: Never  Substance Use Topics   Alcohol use: Yes    Comment: 3-40 ounces   Drug use: No     Allergies   Penicillins   Review of Systems Review of Systems  Constitutional: Negative.   HENT:  Positive for congestion, facial swelling, rhinorrhea, sinus pressure and sinus pain. Negative for dental problem, drooling, ear discharge, ear pain, hearing loss, mouth sores, nosebleeds, postnasal drip, sneezing, sore throat, tinnitus, trouble swallowing and voice change.   Respiratory:  Positive for cough, shortness of breath and wheezing. Negative for apnea, choking, chest tightness and stridor.   Cardiovascular: Negative.   Gastrointestinal:  Positive for abdominal pain and diarrhea. Negative for abdominal distention, anal bleeding, blood in stool, constipation, nausea, rectal pain and vomiting.  Skin: Negative.   Neurological: Negative.     Physical Exam Triage Vital Signs ED Triage Vitals  Enc Vitals Group     BP 06/07/21 1824 (!) 144/79     Pulse Rate 06/07/21 1824 93     Resp 06/07/21 1824 18     Temp 06/07/21 1824 98 F (36.7 C)     Temp src --      SpO2 06/07/21 1824 97 %     Weight --  Height --      Head Circumference --      Peak Flow --      Pain Score 06/07/21 1823 0     Pain Loc --      Pain Edu? --      Excl. in GC? --    No data found.  Updated Vital Signs BP (!) 144/79 (BP Location: Left Arm)   Pulse 93   Temp 98 F (36.7 C)   Resp 18   SpO2 97%   Visual Acuity Right Eye Distance:   Left Eye Distance:   Bilateral Distance:    Right Eye Near:   Left Eye Near:    Bilateral Near:     Physical Exam Constitutional:      Appearance: Normal appearance. He is well-developed and normal weight.  HENT:     Head: Normocephalic.     Right Ear: Tympanic membrane, ear canal and external ear normal.     Left Ear: Tympanic membrane, ear canal and external ear normal.     Nose: Congestion and rhinorrhea  present.     Right Sinus: Frontal sinus tenderness present. No maxillary sinus tenderness.     Left Sinus: Frontal sinus tenderness present. No maxillary sinus tenderness.     Mouth/Throat:     Mouth: Mucous membranes are moist.     Pharynx: Posterior oropharyngeal erythema present.  Eyes:     Extraocular Movements: Extraocular movements intact.  Cardiovascular:     Rate and Rhythm: Normal rate and regular rhythm.     Pulses: Normal pulses.     Heart sounds: Normal heart sounds.  Pulmonary:     Effort: Pulmonary effort is normal.     Breath sounds: Normal breath sounds.  Musculoskeletal:        General: Normal range of motion.     Cervical back: Normal range of motion.  Skin:    General: Skin is warm and dry.  Neurological:     General: No focal deficit present.     Mental Status: He is alert and oriented to person, place, and time. Mental status is at baseline.  Psychiatric:        Mood and Affect: Mood normal.        Behavior: Behavior normal.     UC Treatments / Results  Labs (all labs ordered are listed, but only abnormal results are displayed) Labs Reviewed - No data to display  EKG   Radiology No results found.  Procedures Procedures (including critical care time)  Medications Ordered in UC Medications - No data to display  Initial Impression / Assessment and Plan / UC Course  I have reviewed the triage vital signs and the nursing notes.  Pertinent labs & imaging results that were available during my care of the patient were reviewed by me and considered in my medical decision making (see chart for details).  Acute nonrecurrent frontal sinusitis  1.  Doxycycline 100 mg twice daily for 7 days, confirmed penicillin allergy 2.  Prednisone 40 mg daily for 5 days 3.  Albuterol inhaler 2 puffs every 4 hours as needed 4.  Continue use of over-the-counter medications for symptom management 5.  Urgent care PCP follow-up for persistent or recurrent symptoms 6.   Given walking referral to pulmonology for worsening symptoms Final Clinical Impressions(s) / UC Diagnoses   Final diagnoses:  None   Discharge Instructions   None    ED Prescriptions   None    PDMP not reviewed  this encounter.   Valinda Hoar, [place] 06/10/21 [phone]

## 2021-06-07 NOTE — Discharge Instructions (Signed)
You are being treated today for a sinus infection  Take doxycyline twice a day for 7 days  Take prednisone beginning tomorrow every morning with food for 5 days  If able to afford albuterol inhaler may use 2 puffs every 4 hours as needed   Follow up with your primary doctor for persistent symptoms and have also been given information for pulmonology for reevaluation of your lungs and symptoms began after COVID exposure

## 2021-06-11 ENCOUNTER — Encounter (HOSPITAL_COMMUNITY): Payer: Self-pay | Admitting: Emergency Medicine

## 2021-06-11 ENCOUNTER — Other Ambulatory Visit: Payer: Self-pay

## 2021-06-11 ENCOUNTER — Ambulatory Visit (HOSPITAL_COMMUNITY)
Admission: EM | Admit: 2021-06-11 | Discharge: 2021-06-11 | Disposition: A | Discharge status: Home / Self Care | Payer: Self-pay | Attending: Emergency Medicine | Admitting: Emergency Medicine

## 2021-06-11 DIAGNOSIS — T7840XA Allergy, unspecified, initial encounter: Secondary | ICD-10-CM

## 2021-06-11 MED ORDER — METHYLPREDNISOLONE SODIUM SUCC 125 MG IJ SOLR
INTRAMUSCULAR | Status: AC
  Filled 2021-06-11: qty 2

## 2021-06-11 MED ORDER — LOPERAMIDE HCL 2 MG PO CAPS: 2.0000 mg | ORAL_CAPSULE | Freq: Four times a day (QID) | ORAL | 0 refills | Status: DC | PRN

## 2021-06-11 MED ORDER — LEVOFLOXACIN 500 MG PO TABS: 500.0000 mg | ORAL_TABLET | Freq: Every day | ORAL | 0 refills | Status: DC

## 2021-06-11 MED ORDER — METHYLPREDNISOLONE SODIUM SUCC 125 MG IJ SOLR
60.0000 mg | Freq: Once | INTRAMUSCULAR | Status: AC
  Administered 2021-06-11: 60 mg via INTRAMUSCULAR

## 2021-06-11 NOTE — Discharge Instructions (Signed)
Doxycycline is now considered a medication allergy and has been listed so in your chart, moving forward including this medication when asked if you are allergic to anything  Begin taking levofloxacin daily for 7 days, if you have any reaction to this medication and is no able to breathe you may call the urgent care and so we will change the medication for you, if you are having difficulty breathing please come in to be seen, this medication may cause diarrhea, it will resolve after completion of medication, as needed to help slow it down  You may finish the steroid course that you were given

## 2021-06-11 NOTE — ED Provider Notes (Signed)
[facility]    CSN: [id number] Arrival date & time: 06/11/21  3818      History   Chief Complaint Chief Complaint  Patient presents with   Allergic Reaction   Rash    HPI Julian Rogers is a 55 y.o. male.   Patient presents with increased shortness of breath for days after beginning doxycycline.  Endorses that he initially had a rash 2 days ago but has resolved after stopping use of medication.  Was prescribed doxycycline for treatment of sinus infection here in urgent care on 06/07/2021.  Denies wheezing, coughing, itchy throat, difficulty swallowing, chest pain or tightness, pain with deep breathing.  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 Authorizing Provider  albuterol (VENTOLIN HFA) 108 (90 Base) MCG/ACT inhaler Inhale 2 puffs into the lungs every 4 (four) hours as needed for wheezing or shortness of breath. 06/07/21   Valinda Hoar, NP  cetirizine (ZYRTEC) 10 MG tablet Take 1 tablet (10 mg total) by mouth daily. 03/01/21   Mayers, Cari S, PA-C  doxycycline (VIBRAMYCIN) 100 MG capsule Take 1 capsule (100 mg total) by mouth 2 (two) times daily. 06/07/21   Tavius Turgeon, Elita Boone, NP  predniSONE (DELTASONE) 20 MG tablet Take 2 tablets (40 mg total) by mouth daily. 06/07/21   Valinda Hoar, NP    Family History Family History  Problem Relation Age of Onset   Cancer Mother     Social  History Social History   Tobacco Use   Smoking status: Every Day    Packs/day: 0.50    Years: 25.00    Pack years: 12.50    Types: Cigarettes   Smokeless tobacco: Never  Substance Use Topics   Alcohol use: Yes    Comment: 3-40 ounces   Drug use: No     Allergies   Penicillins   Review of Systems Review of Systems  Constitutional: Negative.   HENT: Negative.    Respiratory:  Positive for shortness of breath. Negative for apnea, cough, choking, chest tightness, wheezing and stridor.   Cardiovascular: Negative.   Skin:  Positive for rash. Negative for color change, pallor and wound.  Neurological: Negative.     Physical Exam Triage Vital Signs ED Triage Vitals [06/11/21 1034]  Enc Vitals Group     BP 114/74     Pulse Rate 88     Resp 18     Temp 98.2 F (36.8 C)     Temp Source Oral     SpO2 97 %     Weight      Height      Head Circumference      Peak Flow      Pain Score 0     Pain Loc      Pain Edu?  Excl. in GC?    No data found.  Updated Vital Signs BP 114/74 (BP Location: Left Arm)   Pulse 88   Temp 98.2 F (36.8 C) (Oral)   Resp 18   SpO2 97%   Visual Acuity Right Eye Distance:   Left Eye Distance:   Bilateral Distance:    Right Eye Near:   Left Eye Near:    Bilateral Near:     Physical Exam Eyes:     Extraocular Movements: Extraocular movements intact.  Cardiovascular:     Rate and Rhythm: Normal rate and regular rhythm.     Pulses: Normal pulses.     Heart sounds: Normal heart sounds.  Pulmonary:     Effort: Pulmonary effort is normal.     Breath sounds: Normal breath sounds.  Skin:    General: Skin is warm and dry.     Comments: Appropriate color for ethnicity, no rash noted  Neurological:     Mental Status: He is oriented to person, place, and time. Mental status is at baseline.  Psychiatric:        Mood and Affect: Mood normal.        Behavior: Behavior normal.     UC Treatments / Results  Labs (all labs ordered  are listed, but only abnormal results are displayed) Labs Reviewed - No data to display  EKG   Radiology No results found.  Procedures Procedures (including critical care time)  Medications Ordered in UC Medications - No data to display  Initial Impression / Assessment and Plan / UC Course  I have reviewed the triage vital signs and the nursing notes.  Pertinent labs & imaging results that were available during my care of the patient were reviewed by me and considered in my medical decision making (see chart for details).  Allergic reaction, initial encounter  Lungs are clear in all lobes, O2 saturation 97% on room air, patient in no signs of distress, doxycycline has been added to allergy list and patient has been notified moving forward to notify all prescribers of new allergy, will order levofloxacin 500 mg daily for 7 days, prescribed Imodium as needed, recommended patient to resume steroid course from last evaluation, will give methylprednisone 60 mg IM now to help with shortness of breath, urgent care follow-up as needed Final Clinical Impressions(s) / UC Diagnoses   Final diagnoses:  None   Discharge Instructions   None    ED Prescriptions   None    PDMP not reviewed this encounter.   Valinda Hoar, NP 06/11/21 1056

## 2021-06-11 NOTE — ED Triage Notes (Signed)
Pt seen here last week for sinus infection and started medications Friday. Saturday broke out in rash and had SOB and went to ED to be seen but was told long wait and since had started antibiotic tod to stop it and be seen again at [facility]. Reports rash is gone but still having some SOB.

## 2022-01-13 ENCOUNTER — Observation Stay (HOSPITAL_COMMUNITY)
Admission: EM | Admit: 2022-01-13 | Discharge: 2022-01-14 | Disposition: A | Discharge status: Home / Self Care | Payer: BC Managed Care – PPO | Attending: Internal Medicine | Admitting: Internal Medicine

## 2022-01-13 ENCOUNTER — Emergency Department (HOSPITAL_COMMUNITY): Payer: BC Managed Care – PPO

## 2022-01-13 ENCOUNTER — Other Ambulatory Visit: Payer: Self-pay

## 2022-01-13 ENCOUNTER — Encounter (HOSPITAL_COMMUNITY): Payer: Self-pay | Admitting: Emergency Medicine

## 2022-01-13 ENCOUNTER — Observation Stay (HOSPITAL_COMMUNITY): Payer: BC Managed Care – PPO

## 2022-01-13 DIAGNOSIS — F1410 Cocaine abuse, uncomplicated: Secondary | ICD-10-CM | POA: Diagnosis not present

## 2022-01-13 DIAGNOSIS — R0781 Pleurodynia: Secondary | ICD-10-CM | POA: Diagnosis present

## 2022-01-13 DIAGNOSIS — R911 Solitary pulmonary nodule: Secondary | ICD-10-CM | POA: Insufficient documentation

## 2022-01-13 DIAGNOSIS — E872 Acidosis, unspecified: Secondary | ICD-10-CM | POA: Diagnosis not present

## 2022-01-13 DIAGNOSIS — R0609 Other forms of dyspnea: Secondary | ICD-10-CM

## 2022-01-13 DIAGNOSIS — F1990 Other psychoactive substance use, unspecified, uncomplicated: Secondary | ICD-10-CM | POA: Diagnosis present

## 2022-01-13 DIAGNOSIS — F17210 Nicotine dependence, cigarettes, uncomplicated: Secondary | ICD-10-CM | POA: Diagnosis not present

## 2022-01-13 DIAGNOSIS — R079 Chest pain, unspecified: Secondary | ICD-10-CM

## 2022-01-13 DIAGNOSIS — R0602 Shortness of breath: Secondary | ICD-10-CM | POA: Insufficient documentation

## 2022-01-13 DIAGNOSIS — G459 Transient cerebral ischemic attack, unspecified: Secondary | ICD-10-CM | POA: Diagnosis not present

## 2022-01-13 DIAGNOSIS — Z79899 Other long term (current) drug therapy: Secondary | ICD-10-CM | POA: Diagnosis not present

## 2022-01-13 DIAGNOSIS — I639 Cerebral infarction, unspecified: Secondary | ICD-10-CM

## 2022-01-13 DIAGNOSIS — F1999 Other psychoactive substance use, unspecified with unspecified psychoactive substance-induced disorder: Secondary | ICD-10-CM

## 2022-01-13 LAB — CBC
HCT: 47.3 % (ref 39.0–52.0)
Hemoglobin: 16.5 g/dL (ref 13.0–17.0)
MCH: 30 pg (ref 26.0–34.0)
MCHC: 34.9 g/dL (ref 30.0–36.0)
MCV: 86 fL (ref 80.0–100.0)
Platelets: 337 10*3/uL (ref 150–400)
RBC: 5.5 MIL/uL (ref 4.22–5.81)
RDW: 14.1 % (ref 11.5–15.5)
WBC: 6.4 10*3/uL (ref 4.0–10.5)
nRBC: 0 % (ref 0.0–0.2)

## 2022-01-13 LAB — BASIC METABOLIC PANEL
Anion gap: 16 — ABNORMAL HIGH (ref 5–15)
BUN: 13 mg/dL (ref 6–20)
CO2: 20 mmol/L — ABNORMAL LOW (ref 22–32)
Calcium: 9.5 mg/dL (ref 8.9–10.3)
Chloride: 100 mmol/L (ref 98–111)
Creatinine, Ser: 1.2 mg/dL (ref 0.61–1.24)
GFR, Estimated: >60 mL/min (ref >60)
Glucose, Bld: 122 mg/dL — ABNORMAL HIGH (ref 70–99)
Potassium: 3.4 mmol/L — ABNORMAL LOW (ref 3.5–5.1)
Sodium: 136 mmol/L (ref 135–145)

## 2022-01-13 LAB — TROPONIN I (HIGH SENSITIVITY)
Troponin I (High Sensitivity): 3 ng/L (ref <18)
Troponin I (High Sensitivity): 4 ng/L (ref <18)

## 2022-01-13 LAB — CBG MONITORING, ED: Glucose-Capillary: 153 mg/dL — ABNORMAL HIGH (ref 70–99)

## 2022-01-13 LAB — LIPID PANEL
Cholesterol: 176 mg/dL (ref 0–200)
HDL: 52 mg/dL (ref >40)
LDL Cholesterol: 96 mg/dL (ref 0–99)
Total CHOL/HDL Ratio: 3.4 RATIO
Triglycerides: 141 mg/dL (ref <150)
VLDL: 28 mg/dL (ref 0–40)

## 2022-01-13 LAB — PROTIME-INR
INR: 1 (ref 0.8–1.2)
Prothrombin Time: 13.2 seconds (ref 11.4–15.2)

## 2022-01-13 LAB — RAPID URINE DRUG SCREEN, HOSP PERFORMED
Amphetamines: NOT DETECTED
Barbiturates: NOT DETECTED
Benzodiazepines: NOT DETECTED
Cocaine: POSITIVE — AB
Opiates: NOT DETECTED
Tetrahydrocannabinol: NOT DETECTED

## 2022-01-13 LAB — HIV ANTIBODY (ROUTINE TESTING W REFLEX): HIV Screen 4th Generation wRfx: NONREACTIVE

## 2022-01-13 LAB — HEMOGLOBIN A1C
Hgb A1c MFr Bld: 5.6 % (ref 4.8–5.6)
Mean Plasma Glucose: 114.02 mg/dL

## 2022-01-13 MED ORDER — ENOXAPARIN SODIUM 40 MG/0.4ML IJ SOSY
40.0000 mg | PREFILLED_SYRINGE | INTRAMUSCULAR | Status: DC
  Administered 2022-01-13 – 2022-01-14 (×2): 40 mg via SUBCUTANEOUS
  Filled 2022-01-13 (×3): qty 0.4

## 2022-01-13 MED ORDER — SENNOSIDES-DOCUSATE SODIUM 8.6-50 MG PO TABS: 1.0000 | ORAL_TABLET | Freq: Every evening | ORAL | Status: DC | PRN

## 2022-01-13 MED ORDER — ASPIRIN 81 MG PO CHEW
81.0000 mg | CHEWABLE_TABLET | Freq: Every day | ORAL | Status: DC
  Administered 2022-01-13 – 2022-01-14 (×2): 81 mg via ORAL
  Filled 2022-01-13 (×2): qty 1

## 2022-01-13 MED ORDER — STROKE: EARLY STAGES OF RECOVERY BOOK
Freq: Once | Status: DC
  Filled 2022-01-13: qty 1

## 2022-01-13 MED ORDER — ACETAMINOPHEN 160 MG/5ML PO SOLN: 650.0000 mg | ORAL | Status: DC | PRN

## 2022-01-13 MED ORDER — IOHEXOL 350 MG/ML SOLN
100.0000 mL | Freq: Once | INTRAVENOUS | Status: AC | PRN
  Administered 2022-01-13: 100 mL via INTRAVENOUS

## 2022-01-13 MED ORDER — NICOTINE 14 MG/24HR TD PT24
14.0000 mg | MEDICATED_PATCH | Freq: Every day | TRANSDERMAL | Status: DC
  Administered 2022-01-13 – 2022-01-14 (×2): 14 mg via TRANSDERMAL
  Filled 2022-01-13 (×2): qty 1

## 2022-01-13 MED ORDER — LORAZEPAM 2 MG/ML IJ SOLN
1.0000 mg | INTRAMUSCULAR | Status: DC | PRN
  Administered 2022-01-13: 1 mg via INTRAVENOUS
  Filled 2022-01-13: qty 1

## 2022-01-13 MED ORDER — ACETAMINOPHEN 650 MG RE SUPP: 650.0000 mg | RECTAL | Status: DC | PRN

## 2022-01-13 MED ORDER — ATORVASTATIN CALCIUM 40 MG PO TABS
40.0000 mg | ORAL_TABLET | Freq: Every day | ORAL | Status: DC
  Administered 2022-01-13 – 2022-01-14 (×2): 40 mg via ORAL
  Filled 2022-01-13 (×2): qty 1

## 2022-01-13 MED ORDER — ACETAMINOPHEN 325 MG PO TABS: 650.0000 mg | ORAL_TABLET | ORAL | Status: DC | PRN

## 2022-01-13 MED ORDER — CLOPIDOGREL BISULFATE 75 MG PO TABS
75.0000 mg | ORAL_TABLET | Freq: Every day | ORAL | Status: DC
  Administered 2022-01-13 – 2022-01-14 (×2): 75 mg via ORAL
  Filled 2022-01-13 (×2): qty 1

## 2022-01-13 NOTE — ED Notes (Addendum)
Pt left ED with transport via wheelchair

## 2022-01-13 NOTE — ED Triage Notes (Signed)
Patient reports central chest pain radiating to right arm with SOB , nausea and diaphoresis onset this afternoon , speech clear /no facial asymmetry , equal grips.

## 2022-01-13 NOTE — ED Notes (Signed)
The pt just returned from  c-t 

## 2022-01-13 NOTE — Progress Notes (Addendum)
STROKE TEAM PROGRESS NOTE   INTERVAL HISTORY Patient is seen in his room with one family member at the bedside.  Yesterday, he had an episode of chest pain, diaphoresis, feeling like he was about to pass out and aphasia with inability to get his words out.He presented to the ED and is undergoing evaluation for his symptoms.  His MRI was negative for acute stroke, and CTA showed no LVO or significant stenosis.  Episode may have been in  part a TIA.  Vitals:   01/13/22 0830 01/13/22 0845 01/13/22 1000 01/13/22 1015  BP: 121/75 115/75 (!) 134/91 129/83  Pulse: 94 98 (!) 102 (!) 104  Resp: 18 18 17 15   Temp:      TempSrc:      SpO2: 93% 94% 97% 98%   CBC:  Recent Labs  Lab 01/13/22 0149  WBC 6.4  HGB 16.5  HCT 47.3  MCV 86.0  PLT 337   Basic Metabolic Panel:  Recent Labs  Lab 01/13/22 0149  NA 136  K 3.4*  CL 100  CO2 20*  GLUCOSE 122*  BUN 13  CREATININE 1.20  CALCIUM 9.5   Lipid Panel:  Recent Labs  Lab 01/13/22 0856  CHOL 176  TRIG 141  HDL 52  CHOLHDL 3.4  VLDL 28  LDLCALC 96   HgbA1c:  Recent Labs  Lab 01/13/22 0856  HGBA1C 5.6   Urine Drug Screen:  Recent Labs  Lab 01/13/22 0536  LABOPIA NONE DETECTED  COCAINSCRNUR POSITIVE*  LABBENZ NONE DETECTED  AMPHETMU NONE DETECTED  THCU NONE DETECTED  LABBARB NONE DETECTED    Alcohol Level No results for input(s): "ETH" in the last 168 hours.  IMAGING past 24 hours MR BRAIN WO CONTRAST  Result Date: 01/13/2022 CLINICAL DATA:  Transient ischemic attack (TIA) EXAM: MRI HEAD WITHOUT CONTRAST TECHNIQUE: Multiplanar, multiecho pulse sequences of the brain and surrounding structures were obtained without intravenous contrast. COMPARISON:  CT head same day. FINDINGS: Brain: No acute infarction, hemorrhage, hydrocephalus, extra-axial collection or mass lesion. Small remote left cerebellar infarct. Vascular: Major arterial flow voids are maintained at the skull base. Skull and upper cervical spine: Normal marrow  signal. Sinuses/Orbits: Minimal paranasal sinus mucosal thickening. Right enucleation with prosthesis. Other: No mastoid effusions. IMPRESSION: 1. No evidence of acute intracranial abnormality. 2. Small remote left cerebellar infarct. Electronically Signed   By: 03/16/2022 M.D.   On: 01/13/2022 09:45   CT ANGIO HEAD NECK W WO CM  Result Date: 01/13/2022 CLINICAL DATA:  Dizziness, nonspecific with concern for TIA EXAM: CT ANGIOGRAPHY HEAD AND NECK TECHNIQUE: Multidetector CT imaging of the head and neck was performed using the standard protocol during bolus administration of intravenous contrast. Multiplanar CT image reconstructions and MIPs were obtained to evaluate the vascular anatomy. Carotid stenosis measurements (when applicable) are obtained utilizing NASCET criteria, using the distal internal carotid diameter as the denominator. RADIATION DOSE REDUCTION: This exam was performed according to the departmental dose-optimization program which includes automated exposure control, adjustment of the mA and/or kV according to patient size and/or use of iterative reconstruction technique. CONTRAST:  03/16/2022 OMNIPAQUE IOHEXOL 350 MG/ML SOLN COMPARISON:  None Available. FINDINGS: CT HEAD FINDINGS Brain: Small left cerebellar infarct which is age indeterminate. No hemorrhage, hydrocephalus, or masslike finding Vascular: See below Skull: Normal. Negative for fracture or focal lesion. Sinuses: Negative Orbits: Right enucleation with prosthesis. Review of the MIP images confirms the above findings CTA NECK FINDINGS Aortic arch: Negative with 3 vessel branching Right carotid  system: Vessels are smoothly contoured and diffusely patent Left carotid system: Vessels are smoothly contoured and diffusely patent Vertebral arteries: No proximal subclavian or vertebral artery stenosis or ulceration. Skeleton: Generalized cervical spine degeneration there is a generalized cervical disc degeneration, notable for age. Other  neck: No acute finding Upper chest: 1 cm nodule at the right apex. This nodule was 5 mm in 2016, reference chest CT report. Centrilobular emphysema. Review of the MIP images confirms the above findings CTA HEAD FINDINGS Anterior circulation: No branch occlusion, beading, significant stenosis, or aneurysm Posterior circulation: No branch occlusion, beading, significant stenosis, or aneurysm Venous sinuses: As permitted by contrast timing, patent. Anatomic variants: None significant Review of the MIP images confirms the above findings IMPRESSION: 1. Small age-indeterminate infarct in the left cerebellum. 2. Negative CTA. Electronically Signed   By: Tiburcio Pea M.D.   On: 01/13/2022 07:49   CT Angio Chest PE W and/or Wo Contrast  Result Date: 01/13/2022 CLINICAL DATA:  Chest pain and shortness of breath, high probability for pulmonary embolism. EXAM: CT ANGIOGRAPHY CHEST WITH CONTRAST TECHNIQUE: Multidetector CT imaging of the chest was performed using the standard protocol during bolus administration of intravenous contrast. Multiplanar CT image reconstructions and MIPs were obtained to evaluate the vascular anatomy. RADIATION DOSE REDUCTION: This exam was performed according to the departmental dose-optimization program which includes automated exposure control, adjustment of the mA and/or kV according to patient size and/or use of iterative reconstruction technique. CONTRAST:  OMNIPAQUE IOHEXOL 350 MG/ML SOLN COMPARISON:  12/02/2014 FINDINGS: Cardiovascular: Satisfactory opacification of the pulmonary arteries to the segmental level. No evidence of pulmonary embolism. Normal heart size. No pericardial effusion. Mediastinum/Nodes: Negative for adenopathy or mass Lungs/Pleura: Centrilobular emphysema. 1 cm pulmonary nodule in the right upper lobe with indistinct margins and increased size from before, although long interval since prior study. Generalized airway thickening. Band of scar-like opacity at the  medial left apex. There is no edema, consolidation, effusion, or pneumothorax. Upper Abdomen: No acute finding Musculoskeletal: No acute finding Review of the MIP images confirms the above findings. IMPRESSION: 1. Worrisome 1 cm pulmonary nodule in the right upper lobe. Consider one of the following in 3 months for both low-risk and high-risk individuals: (a) repeat chest CT, (b) follow-up PET-CT, or (c) tissue sampling. This recommendation follows the consensus statement: Guidelines for Management of Incidental Pulmonary Nodules Detected on CT Images: From the Fleischner Society 2017; Radiology 2017; 284:228-243. 2. Negative for pulmonary embolism. 3. Emphysema. Electronically Signed   By: Tiburcio Pea M.D.   On: 01/13/2022 07:34   DG Chest 2 View  Result Date: 01/13/2022 CLINICAL DATA:  Chest pain EXAM: CHEST - 2 VIEW COMPARISON:  None Available. FINDINGS: Heart and mediastinal contours are within normal limits. No focal opacities or effusions. No acute bony abnormality. IMPRESSION: No active cardiopulmonary disease. Electronically Signed   By: Charlett Nose M.D.   On: 01/13/2022 02:18    PHYSICAL EXAM General:  Alert, well-nourished, well-developed patient in no acute distress Respiratory:  Regular, unlabored respirations on room  air  NEURO:  Mental Status: AA&Ox3  Speech/Language: speech is without dysarthria or aphasia.  Fluency, and comprehension intact.  Cranial Nerves:  II: Right eye nonreactive (this is patient's baseline) III, IV, VI: Eyes dysconjugate, EOMI in left eye V: Sensation is intact to light touch and symmetrical to face.  VII: Smile is symmetrical.   VIII: hearing intact to voice. IX, X: Phonation is normal.  XII: tongue is midline without fasciculations. Motor:  5/5 strength to all muscle groups tested.  Tone: is normal and bulk is normal Sensation- Intact to light touch bilaterally.  Coordination: FTN intact bilaterally.No drift.  Gait- deferred      ASSESSMENT/PLAN Mr. Julian Rogers is a 56 y.o. 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  His MRI was negative for acute stroke, and CTA showed no LVO or significant stenosis.  Episode may have been in  part a TIA.  TIA:  likely left brain TIA due to multiple risk factors including smoker, alcohol abuse, and cocaine use CT head No acute abnormality. ? Age-indeterminate left cerebellar infarct  CTA head & neck no LVO or significant stenosis MRI  remote small left cerebellar infarct, no acute abnormality 2D Echo pending LDL 96 HgbA1c 5.6 UDS positive for cocaine VTE prophylaxis - lovenox No antithrombotic prior to admission, now on aspirin 81 mg daily and clopidogrel 75 mg daily DAPT for 3 weeks and then ASA alone. Therapy recommendations:  pending Disposition:  pending  Hypertension Home meds:  none Stable Long-term BP goal normotensive  Hyperlipidemia Home meds:  none LDL 96, goal < 70 Add atorvastatin 40 mg daily  Continue statin at discharge  Tobacco abuse Current smoker Smoking cessation counseling provided Nicotine patch provided Pt is willing to quit  Cocaine abuse UDS positive for cocaine Cessation education provided Pt is willing to quit  Other Stroke Risk Factors Alcohol abuse, 40oz beer daily, educated on limitation of alcohol use.  No more than 2 drinks per day.  Other Active Problems Chronic right eye blind since infancy CP/SOB for months - management per primary team  Hospital day # [place] , MSN, AGACNP-BC [facility] See Amion for schedule and pager information 01/13/2022 11:41 AM  ATTENDING NOTE: I reviewed above note and agree with the assessment and plan. Pt was seen and examined.    56 year old male with history of smoker, alcohol abuse, cocaine abuse, right eye blind from infancy admitted for episode of right facial droop, right-sided weakness and difficulty speaking.  Currently symptom resolved.  CT no acute abnormality, age-indeterminate left cerebellar small infarct.  CTA head and neck unremarkable.  MRI no acute infarct, old small left cerebellar infarct.  2D echo pending, LDL 96, A1c 5.6.  UDS positive for cocaine.  Creatinine 1.20.  On exam, wife at bedside.  Patient sitting in bed for lunch.  Neurologically intact, no focal deficit except chronic right eye blindness.  Etiology of patient's symptoms concerning for TIA in the setting of multiple uncontrolled risk factors including smoking, alcohol abuse and cocaine use.  Aggressive risk factor modification.  Smoking and cocaine cessation education provided.  Limited alcohol use education also provided.  Currently on aspirin 81 plan 75 DAPT for 3 weeks and then aspirin alone.  Put on Lipitor 40 for HLD and stroke prevention.  PT/OT pending.  Will follow.  For detailed assessment and plan, please refer to above/below as I have made changes wherever appropriate.   Rosalin Hawking, MD PhD Stroke Neurology 01/13/2022 2:29 PM      To contact Stroke Continuity provider, please refer to [email or web address] After hours, contact General Neurology

## 2022-01-13 NOTE — ED Notes (Signed)
Report given to [facility] on 5W and pt transport put in.

## 2022-01-13 NOTE — Progress Notes (Signed)
SLP Cancellation Note  Patient Details Name: Julian Rogers MRN: [id number] DOB: 09-28-1965   Cancelled treatment:       Reason Eval/Treat Not Completed: SLP screened, no needs identified, will sign off  Per EMR, pt's communication difficulty resolved by time of his presentation to the ED, and MRI was negative for acute changes. Neurology is treating pt as a TIA and did not observe any evidence of aphasia or dysarthria. A formal evaluation does not appear to be clinically indicated at this time. SLP will sign off.   Ramona Ruark I. Vear Clock, MS, CCC-SLP Acute Rehabilitation Services Office number [phone] Pager [phone]  Scheryl Marten 01/13/2022, 11:34 AM

## 2022-01-13 NOTE — ED Notes (Signed)
Pt lying in bed with eyes closed, respiratoins even and unlabored

## 2022-01-13 NOTE — H&P (Cosign Needed Addendum)
NAME:  Marzell Isakson, MRN:  [id number], DOB:  11-02-1965, LOS: 0 ADMISSION DATE:  01/13/2022, Primary: Pcp, No  CHIEF COMPLAINT:  slurred speech   Medical Service: Internal Medicine Teaching Service         Attending Physician: Dr. Inez Catalina, MD    First Contact: Dr. Benito Mccreedy Pager: [phone]  Second Contact: Dr. Kirke Corin Pager: [phone]       After Hours (After 5p/  First Contact Pager: [phone]  weekends / holidays): Second Contact Pager: [phone]   HISTORY OF PRESENT ILLNESS   Juanmanuel Marohl is 56yo person without significant medical history presenting to [facility] after acute episode of slurred speech, disorientation overnight in setting of progressive dyspnea and chest pain. 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 Also mentions mid-sternal, pressure-like chest pain that intermittently occurs as well, although associated this more with when he coughs. His fiance has noticed some intermittent lower extremity swelling and he reports occasional PND. Denies orthopnea, abdominal swelling, headaches, vision changes, palpitations. Last evening, patient reports acute onset of difficulty speaking and disorientation. States it felt like he was "moving away from his body." Patient's wife mentions him also feeling off-balance. Denies any focal weaknesses or paresthesias. Reported symptoms lasted for 15-20 minutes and has since resolved. He has not had similar symptoms in the past. Mr. Bakos currently does not have a primary care physician.   PCP: Pcp, No  ED COURSE   In ED, patient arrived hemodynamically stable sating well on room air. Lab work overall unremarkable. Neurology consulted in ED, concern for possible TIA. IMTS subsequently consulted for admission.  PAST MEDICAL HISTORY   He,  has a past medical history of Blind right eye and Chronic shoulder pain.   HOME MEDICATIONS   No home  medications.  ALLERGIES   Allergies as of 01/13/2022 - Review Complete 01/13/2022  Allergen Reaction Noted   Penicillins Itching 02/07/2014   Doxycycline Rash 06/11/2021    SOCIAL HISTORY   Quadry Kampa lives here in [place] with his fiance. He currently works in [employer] and is required to move heavy objects frequently. He is able to complete his ADL's and IADL's independently without difficulty. Does not use ambulation assistive device. Reports ~1/2ppd for the last 40 years, although reports this can vary depending on the day. Also mentions 2 40oz beers daily. Reports occasional marijuana use, denies cocaine/heroin.   FAMILY HISTORY   His family history includes Cancer in his mother.   REVIEW OF SYSTEMS   ROS per history of present illness.  PHYSICAL EXAMINATION   Blood pressure 129/83, pulse (!) 104, temperature 98.4 F (36.9 C), temperature source Oral, resp. rate 15, SpO2 98 %.    There were no vitals filed for this visit.  GENERAL: Well-kept, well-appearing person resting comfortably in no acute distress HENT: Normocephalic, atraumatic. Neck supple. Moist mucous membranes. EYES: Artifical eye on R. Ptosis present on R. Vision grossly in tact on L. No scleral icterus. CV: Regular rate, rhythm. No murmurs appreciated. Warm extremities. No JVD PULM: Normal work of breathing on room air. Clear to ausculation bilaterally. GI: Abdomen soft, non-tender, non-distended. Normoactive bowel sounds.  MSK: Normal bulk, tone. No pitting edema bilateral lower extremities. SKIN: Warm, dry. No rashes or lesions appreciated.  NEURO: Awake, alert, conversing appropriately. Ptosis on R (chronic), otherwise cranial nerves appear in tact. Motor function 5/5 throughout all extremities. Sensation in  tact. Cerebellar testing normal.  PSYCH: Normal mood, affect, speech.    SIGNIFICANT DIAGNOSTIC TESTS   ECG: Normal sinus rhythm. Slight right axis deviation. Q waves in inferior  leads appear larger than previous, borderline criteria for pathological Q waves.   I personally reviewed patient's ECG with my interpretation as above.  CXR: Trachea midline, normal cardiac contour. Good aeration in both lungs. No focal consolidation, effusions.   I personally reviewed patient's CXR with my interpretation as above.  LABS      Latest Ref Rng & Units 01/13/2022    1:49 AM 12/01/2014   11:13 PM 05/22/2012    8:46 AM  CBC  WBC 4.0 - 10.5 K/uL 6.4  6.1  4.5   Hemoglobin 13.0 - 17.0 g/dL 59.9  77.4  14.2   Hematocrit 39.0 - 52.0 % 47.3  43.5  44.4   Platelets 150 - 400 K/uL 337  271  245       Latest Ref Rng & Units 01/13/2022    1:49 AM 12/01/2014   11:13 PM 05/22/2012    8:46 AM  BMP  Glucose 70 - 99 mg/dL 395  95  90   BUN 6 - 20 mg/dL 13  11  19    Creatinine 0.61 - 1.24 mg/dL  3.20  2.33   Sodium 135 - 145 mmol/L 136  136  141   Potassium 3.5 - 5.1 mmol/L 3.4  3.2  4.5   Chloride 98 - 111 mmol/L 100  100  108   CO2 22 - 32 mmol/L 20  24  24    Calcium 8.9 - 10.3 mg/dL 9.5  9.2  9.8     CONSULTS   neurology  ASSESSMENT   Jordell Outten is 56yo person without significant medical history admitted 7/9 with likely transient ischemic attack.  PLAN   Principal Problem:   TIA (transient ischemic attack) Active Problems:   Polysubstance use disorder   Dyspnea on exertion  #Transient ischemic attack Patient had acute symptoms of dysarthria, altered mental status, and disequilibrium that lasted roughly 15-20 minutes overnight. Upon arrival to [facility], he was hemodynamically stable, sating well on room air. Neurology was consulted in ED, appreciate their recommendations. Imaging thus far has revealed small age-indeterminate infarct in the left cerebellum via CTA head/neck. We will further investigate with MRI brain. Currently, he does not have any symptoms or focal neurological deficits on exam. Patient does have significant history of tobacco use, will need  counseling on cessation. He has received aspirin load already, will follow-up MRI and discuss with neurology best plan moving forward.  - Appreciate neurology's recommendations - Follow-up MRI brain without contrast - Antiplatelet therapy pending imaging - Follow-up A1c, lipid panel - Follow-up Echocardiogram - Frequent neuro checks - PT/OT/SLP - Tele  #Anginal chest pain #Progressive dyspnea on exertion Patient reports intermittent, mid-sternal, pressure like chest pain over the last few months. He believes this is largely due to a chronic cough. However, he has also experienced progressive dyspnea on exertion and reports paroxysmal nocturnal dyspnea. He has multiple risk factors for cardiovascular disease. ECG on arrival did reveal larger Q waves in inferior leads, borderline pathologic criteria. Troponin negative. He is not currently experiencing chest pain. We will obtain Echocardiogram here, will likely need outpatient stress test at least.  - Follow-up Echocardiogram  #Polysubstance use disorder Patient has significant tobacco history, ~1/2ppd along with drinking at least 2 40oz beers daily. He denies cocaine use, although this was reported on UDS. Most  likely these have contributed to his current presentation and could be partially the cause of a potential cardiomyopathy. Will need counseling regarding cessation. - TOC consult  #Anion gap metabolic acidosis Has elevated gap of 16 with bicarb of 20, renal function at baseline. I am unsure what this could be from, he has been eating/drinking normally and does not look infectious. Will follow-up repeat BMP. - Repeat BMP in AM  #Pulmonary nodule Found incidentally on CTA. Found in right upper lobe, recommending three month follow-up for repeat scanning. - Repeat imaging in three months  BEST PRACTICE   DIET: regular IVF: n/a DVT PPX: lovenox BOWEL: senokot-s CODE: full FAM COM: fiance at bedside this morning  DISPO: Admit  patient to Observation with expected length of stay less than 2 midnights.  Evlyn Kanner, MD Internal Medicine Resident PGY-3 Pager [phone] 01/13/2022 10:43 AM

## 2022-01-13 NOTE — Consult Note (Signed)
Neurology Consultation Reason for Consult: Transient facial droop Referring Physician: Palombo, a  CC: Facial droop  History is obtained from: Patient, wife  HPI: Julian Rogers is a 56 y.o. male who does not see a physician on a regular basis, who presents with chest pain/shortness of breath as well as an episode of right facial droop, right-sided weakness and difficulty speaking.  He states that he has been short of breath with any type of exertion for several months.  He had chest pain today before his episode.  He states that the episode of right-sided weakness lasted for few minutes and is now completely resolved.   tpa given?: no, resolution of symptoms   Past Medical History:  Diagnosis Date   Blind right eye    Newborn infection resuling in vision loss   Chronic shoulder pain      Family History  Problem Relation Age of Onset   Cancer Mother      Social History:  reports that he has been smoking cigarettes. He has a 12.50 pack-year smoking history. He has never used smokeless tobacco. He reports current alcohol use. He reports that he does not use drugs.   Exam: Current vital signs: BP 127/74   Pulse 96   Temp 98.4 F (36.9 C) (Oral)   Resp 16   SpO2 96%  Vital signs in last 24 hours: Temp:  [98.4 F (36.9 C)] 98.4 F (36.9 C) (07/09 0132) Pulse Rate:  [96-105] 96 (07/09 0515) Resp:  [15-20] 16 (07/09 0515) BP: (125-142)/(74-84) 127/74 (07/09 0515) SpO2:  [91 %-96 %] 96 % (07/09 0515)   Physical Exam  Constitutional: Appears well-developed and well-nourished.  Psych: Affect appropriate to situation Eyes: No scleral injection HENT: No OP obstruction MSK: no joint deformities.  Cardiovascular: Normal rate and regular rhythm.  Respiratory: Effort normal, non-labored breathing GI: Soft.  No distension. There is no tenderness.  Skin: WDI  Neuro: Mental Status: Patient is awake, alert, oriented to person, place, month, year, and situation. Patient is  able to give a clear and coherent history. No signs of aphasia or neglect Cranial Nerves: II: Visual Fields are full.  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  VIII: hearing is intact to voice X: Uvula elevates symmetrically XI: Shoulder shrug is symmetric. XII: tongue is midline without atrophy or fasciculations.  Motor: Tone is normal. Bulk is normal. 5/5 strength was present in all four extremities.  Sensory: Sensation is symmetric to light touch and temperature in the arms and legs. Cerebellar: FNF and HKS are intact bilaterally   I have reviewed labs in epic and the results pertinent to this consultation are: UDS is positive for cocaine  Impression: 56 year old male with transient right-sided weakness and aphasia.  From description, this is most consistent with transient ischemic attack.  Though he does not have any clear evidence on chest x-ray, I do think an echocardiogram to rule out cardiomyopathy would be prudent given his progressive dyspnea on exertion over the past few months.  I would favor treating this as TIA.  Recommendations: - HgbA1c, fasting lipid panel - MRI of the brain without contrast - Frequent neuro checks - Echocardiogram - CTA head and neck - Prophylactic therapy-Antiplatelet med: Aspirin - dose 325 mg daily - Risk factor modification - Telemetry monitoring - PT consult,  OT consult, Speech consult -I recommended smoking cessation, would also recommend cocaine cessation - Stroke team to follow    Ritta Slot, MD [facility] [phone]  If 7pm- 7am, please page neurology on call as listed in AMION.

## 2022-01-13 NOTE — ED Notes (Signed)
Patient transported to MRI 

## 2022-01-13 NOTE — ED Notes (Signed)
The pt has had periods of sweating for no particular reason some speech diffficulty  for the past 3-4 days no previous history

## 2022-01-13 NOTE — ED Notes (Addendum)
Axox4. Fiance at bedside.

## 2022-01-13 NOTE — ED Provider Notes (Signed)
[facility] EMERGENCY DEPARTMENT Provider Note   CSN: [id number] Arrival date & time: 01/13/22  0128     History  Chief Complaint  Patient presents with   Chest Pain    Levent Kornegay is a 56 y.o. male.  56 year old male presents today for evaluation of chest pain, shortness of breath.  States chest pain started yesterday and persisted throughout the day.  Around evening time he had an episode of worsening chest pain, shortness of breath, and diaphoresis.  Chest pain was substernal and did not radiate.  Following the episode patient and his fiance state patient had difficulty with his speech, right upper extremity numbness and tingling.  Also states during this time he had disequilibrium.  Reports he is at baseline at this time.  Still has chest pain, shortness of breath.  Pain is pleuritic in nature.  Without leg pain or swelling.  He has had dyspnea on exertion for some time now.  Chronic 3 pillow orthopnea present.  The history is provided by the patient. No language interpreter was used.       Home Medications Prior to Admission medications   Medication Sig Start Date End Date Taking? Authorizing Provider  albuterol (VENTOLIN HFA) 108 (90 Base) MCG/ACT inhaler Inhale 2 puffs into the lungs every 4 (four) hours as needed for wheezing or shortness of breath. 06/07/21   Valinda Hoar, NP  cetirizine (ZYRTEC) 10 MG tablet Take 1 tablet (10 mg total) by mouth daily. 03/01/21   Mayers, Cari S, PA-C  levofloxacin (LEVAQUIN) 500 MG tablet Take 1 tablet (500 mg total) by mouth daily. 06/11/21   Valinda Hoar, NP  loperamide (IMODIUM) 2 MG capsule Take 1 capsule (2 mg total) by mouth 4 (four) times daily as needed for diarrhea or loose stools. 06/11/21   White, Elita Boone, NP  predniSONE (DELTASONE) 20 MG tablet Take 2 tablets (40 mg total) by mouth daily. 06/07/21   Valinda Hoar, NP      Allergies    Penicillins and Doxycycline    Review of Systems   Review of  Systems  Constitutional:  Positive for diaphoresis. Negative for chills and fever.  Respiratory:  Positive for shortness of breath.   Cardiovascular:  Positive for chest pain. Negative for palpitations and leg swelling.  Gastrointestinal:  Negative for nausea and vomiting.  Neurological:  Positive for dizziness and numbness. Negative for syncope and weakness.  All other systems reviewed and are negative.   Physical Exam Updated Vital Signs BP 125/84 (BP Location: Left Arm)   Pulse (!) 105   Temp 98.4 F (36.9 C) (Oral)   Resp 20   SpO2 96%  Physical Exam Vitals and nursing note reviewed.  Constitutional:      General: He is not in acute distress.    Appearance: Normal appearance. He is not ill-appearing.  HENT:     Head: Normocephalic and atraumatic.     Nose: Nose normal.  Eyes:     General: No scleral icterus.    Extraocular Movements: Extraocular movements intact.     Conjunctiva/sclera: Conjunctivae normal.  Cardiovascular:     Rate and Rhythm: Regular rhythm. Tachycardia present.     Pulses: Normal pulses.  Pulmonary:     Effort: Pulmonary effort is normal. No respiratory distress.     Breath sounds: Normal breath sounds. No wheezing or rales.  Abdominal:     General: There is no distension.     Palpations: Abdomen is soft.  Tenderness: There is no abdominal tenderness. There is no guarding.  Musculoskeletal:        General: Normal range of motion.     Cervical back: Normal range of motion.     Right lower leg: No edema.     Left lower leg: No edema.  Skin:    General: Skin is warm and dry.  Neurological:     General: No focal deficit present.     Mental Status: He is alert. Mental status is at baseline.     Comments: Cranial nerves III through XII intact.  Full range of motion of bilateral upper and lower extremities.  5/5 strength in extensor and flexor muscle groups of upper and lower extremities.  Sensation intact and symmetrical bilaterally.  Tongue  midline.  Without dysarthria or facial droop.  Without pronator drift.     ED Results / Procedures / Treatments   Labs (all labs ordered are listed, but only abnormal results are displayed) Labs Reviewed  BASIC METABOLIC PANEL - Abnormal; Notable for the following components:      Result Value   Potassium 3.4 (*)    CO2 20 (*)    Glucose, Bld 122 (*)    Anion gap 16 (*)    All other components within normal limits  CBG MONITORING, ED - Abnormal; Notable for the following components:   Glucose-Capillary 153 (*)    All other components within normal limits  CBC  PROTIME-INR  TROPONIN I (HIGH SENSITIVITY)  TROPONIN I (HIGH SENSITIVITY)    EKG EKG Interpretation  Date/Time:  Sunday January 13 2022 01:39:30 EDT Ventricular Rate:  108 PR Interval:  180 QRS Duration: 86 QT Interval:  354 QTC Calculation: 474 R Axis:   98 Text Interpretation: Sinus tachycardia Rightward axis Confirmed by Palumbo, April ([id number]) on 01/13/2022 4:03:20 AM  Radiology DG Chest 2 View  Result Date: 01/13/2022 CLINICAL DATA:  Chest pain EXAM: CHEST - 2 VIEW COMPARISON:  None Available. FINDINGS: Heart and mediastinal contours are within normal limits. No focal opacities or effusions. No acute bony abnormality. IMPRESSION: No active cardiopulmonary disease. Electronically Signed   By: Charlett Nose M.D.   On: 01/13/2022 02:18    Procedures Procedures    Medications Ordered in ED Medications - No data to display  ED Course/ Medical Decision Making/ A&P Clinical Course as of 01/13/22 0640  Wynelle Link Jan 13, 2022  0529 Discussed patient's neurological symptoms that came on immediately following his chest pain, shortness of breath episode with neurologist (Dr. Amada Jupiter).  He recommends CT angio of head and neck, as well as MRI brain.  If these are unrevealing he recommends starting patient on aspirin and follow-up with outpatient neurology.  If there is any stenosis patient will need to be admitted for inpatient  TIA work-up. On reevaluation patient reports improvement in chest pain.  Without widened mediastinum on chest x-ray.  BP stable.  Low suspicion for dissection. [AA]  [id number] Neurology recommends patient will need an echocardiogram.  He will need to be admitted for further TIA work-up.  CT imaging still pending. [AA]    Clinical Course User Index [AA] Marita Kansas, PA-C                           Medical Decision Making Amount and/or Complexity of Data Reviewed Labs: ordered. Radiology: ordered.  Risk Prescription drug management. Decision regarding hospitalization.   Medical Decision Making / ED Course   This patient  presents to the ED for concern of chest pain, shortness of breath, strokelike symptoms that she, this involves an extensive number of treatment options, and is a complaint that carries with it a high risk of complications and morbidity.  The differential diagnosis includes ACS, PE, pneumonia, aortic dissection, CVA, TIA  MDM: 56 year old male presents today for evaluation of chest pain, shortness of breath, strokelike symptoms.  This occurred around suppertime.  Strokelike symptoms were transient.  He is currently at his baseline.  Neurological exam is nonfocal deficits.  Reports chest pain has improved to a degree.  Chest pain is pleuritic.  Currently on room air satting mid 90s.  Will discuss with neurology.  CTA chest ordered.  CBC without leukocytosis or anemia.  BMP without acute concerns.  UDS ordered.  Chest x-ray without acute cardiopulmonary process.  Without objective signs of volume overload on exam.  UDS is positive for cocaine.  CT angio pending.  Per neurology patient will need to be admitted for TIA work-up.  Will discuss with hospitalist.   Lab Tests: -I ordered, reviewed, and interpreted labs.   The pertinent results include:   Labs Reviewed  BASIC METABOLIC PANEL - Abnormal; Notable for the following components:      Result Value   Potassium 3.4 (*)    CO2  20 (*)    Glucose, Bld 122 (*)    Anion gap 16 (*)    All other components within normal limits  RAPID URINE DRUG SCREEN, HOSP PERFORMED - Abnormal; Notable for the following components:   Cocaine POSITIVE (*)    All other components within normal limits  CBG MONITORING, ED - Abnormal; Notable for the following components:   Glucose-Capillary 153 (*)    All other components within normal limits  CBC  PROTIME-INR  TROPONIN I (HIGH SENSITIVITY)  TROPONIN I (HIGH SENSITIVITY)      EKG  EKG Interpretation  Date/Time:  Sunday January 13 2022 01:39:30 EDT Ventricular Rate:  108 PR Interval:  180 QRS Duration: 86 QT Interval:  354 QTC Calculation: 474 R Axis:   98 Text Interpretation: Sinus tachycardia Rightward axis Confirmed by Nicanor Alcon, April ([id number]) on 01/13/2022 4:03:20 AM         Imaging Studies ordered: I ordered imaging studies including CTA head, neck, chest.  Chest x-ray I independently visualized and interpreted imaging. I agree with the radiologist interpretation   Medicines ordered and prescription drug management: No orders of the defined types were placed in this encounter.   -I have reviewed the patients home medicines and have made adjustments as needed  Reevaluation: After the interventions noted above, I reevaluated the patient and found that they have :improved  Co morbidities that complicate the patient evaluation  Past Medical History:  Diagnosis Date   Blind right eye    Newborn infection resuling in vision loss   Chronic shoulder pain       Dispostion: Pending callback from hospitalist for admission at the end of my shift.  Patient signed out to oncoming provider to discuss with hospitalist.  Final Clinical Impression(s) / ED Diagnoses Final diagnoses:  TIA (transient ischemic attack)  Chest pain, unspecified type  Cocaine abuse [facility])    Rx / DC Orders ED Discharge Orders     None         Marita Kansas, PA-C 01/13/22 0703     Palumbo, April, MD 01/23/22 2301

## 2022-01-13 NOTE — ED Notes (Signed)
Neuro at bedside.

## 2022-01-13 NOTE — ED Notes (Signed)
Pt eating breakfast 

## 2022-01-13 NOTE — Plan of Care (Signed)

## 2022-01-13 NOTE — Evaluation (Signed)
Occupational Therapy Evaluation Patient Details Name: Julian Rogers MRN: [id number] DOB: Mar 16, 1966 Today's Date: 01/13/2022   History of Present Illness 56 y.o. M admitted on 01/13/22 due to aphasia, chest pain, and diaphoresis. MRI negative. PMH significant for R eye blindness.   Clinical Impression   Pt admitted for concerns listed above. PTA pt reported that he was independent with all ADL's and IADL's, including working and driving. At this time, pt presents back at his baseline with no residual deficits. He continues to present with independence with all ADL's and functional mobility. Pt has no further skilled OT needs and acute OT will sign off.       Recommendations for follow up therapy are one component of a multi-disciplinary discharge planning process, led by the attending physician.  Recommendations may be updated based on patient status, additional functional criteria and insurance authorization.   Follow Up Recommendations  No OT follow up    Assistance Recommended at Discharge PRN  Patient can return home with the following      Functional Status Assessment  Patient has had a recent decline in their functional status and demonstrates the ability to make significant improvements in function in a reasonable and predictable amount of time.  Equipment Recommendations  None recommended by OT    Recommendations for Other Services       Precautions / Restrictions Precautions Precautions: None Restrictions Weight Bearing Restrictions: No      Mobility Bed Mobility Overal bed mobility: Independent                  Transfers Overall transfer level: Independent Equipment used: None                      Balance Overall balance assessment: Mild deficits observed, not formally tested                                         ADL either performed or assessed with clinical judgement   ADL Overall ADL's : At baseline;Independent                                        General ADL Comments: No assist needed, near baseline     Vision Baseline Vision/History: 0 No visual deficits Ability to See in Adequate Light: 0 Adequate Patient Visual Report: No change from baseline Vision Assessment?: No apparent visual deficits     Perception     Praxis      Pertinent Vitals/Pain Pain Assessment Pain Assessment: No/denies pain     Hand Dominance Right   Extremity/Trunk Assessment Upper Extremity Assessment Upper Extremity Assessment: Overall WFL for tasks assessed   Lower Extremity Assessment Lower Extremity Assessment: Overall WFL for tasks assessed   Cervical / Trunk Assessment Cervical / Trunk Assessment: Normal   Communication Communication Communication: No difficulties   Cognition Arousal/Alertness: Awake/alert Behavior During Therapy: WFL for tasks assessed/performed Overall Cognitive Status: Within Functional Limits for tasks assessed                                       General Comments  VSS on RA    Exercises     Shoulder Instructions  Home Living Family/patient expects to be discharged to:: Private residence Living Arrangements: Spouse/significant other Available Help at Discharge: Family;Available 24 hours/day Type of Home: Apartment Home Access: Stairs to enter [employer] of Steps: 3 flights Entrance Stairs-Rails: Right;Left Home Layout: One level     Bathroom Shower/Tub: Chief Strategy Officer: Standard     Home Equipment: None          Prior Functioning/Environment Prior Level of Function : Independent/Modified Independent;Working/employed;Driving                        OT Problem List: Decreased activity tolerance;Decreased strength      OT Treatment/Interventions:      OT Goals(Current goals can be found in the care plan section) Acute Rehab OT Goals Patient Stated Goal: To go home OT Goal  Formulation: With patient Time For Goal Achievement: 01/13/22 Potential to Achieve Goals: Good  OT Frequency:      Co-evaluation              AM-PAC OT "6 Clicks" Daily Activity     Outcome Measure Help from another person eating meals?: None Help from another person taking care of personal grooming?: None Help from another person toileting, which includes using toliet, bedpan, or urinal?: None Help from another person bathing (including washing, rinsing, drying)?: None Help from another person to put on and taking off regular upper body clothing?: None Help from another person to put on and taking off regular lower body clothing?: None 6 Click Score: 24   End of Session Nurse Communication: Mobility status  Activity Tolerance: Patient tolerated treatment well Patient left: in bed;with call bell/phone within reach;with family/visitor present  OT Visit Diagnosis: Unsteadiness on feet (R26.81);Other abnormalities of gait and mobility (R26.89);Muscle weakness (generalized) (M62.81)                Time: 9379-0240 OT Time Calculation (min): 22 min Charges:  OT General Charges $OT Visit: 1 Visit OT Evaluation $OT Eval Moderate Complexity: 1 Mod  Breanna Shorkey H., OTR/L Acute Rehabilitation  Joaquim Tolen Elane Bing Plume 01/13/2022, 5:44 PM

## 2022-01-14 ENCOUNTER — Observation Stay (HOSPITAL_BASED_OUTPATIENT_CLINIC_OR_DEPARTMENT_OTHER): Payer: BC Managed Care – PPO

## 2022-01-14 ENCOUNTER — Other Ambulatory Visit (HOSPITAL_COMMUNITY): Payer: Self-pay

## 2022-01-14 DIAGNOSIS — F1410 Cocaine abuse, uncomplicated: Secondary | ICD-10-CM

## 2022-01-14 DIAGNOSIS — G459 Transient cerebral ischemic attack, unspecified: Secondary | ICD-10-CM

## 2022-01-14 DIAGNOSIS — F17200 Nicotine dependence, unspecified, uncomplicated: Secondary | ICD-10-CM

## 2022-01-14 DIAGNOSIS — F1010 Alcohol abuse, uncomplicated: Secondary | ICD-10-CM

## 2022-01-14 LAB — BASIC METABOLIC PANEL
Anion gap: 7 (ref 5–15)
BUN: 14 mg/dL (ref 6–20)
CO2: 27 mmol/L (ref 22–32)
Calcium: 9.2 mg/dL (ref 8.9–10.3)
Chloride: 105 mmol/L (ref 98–111)
Creatinine, Ser: 1.2 mg/dL (ref 0.61–1.24)
GFR, Estimated: >60 mL/min (ref >60)
Glucose, Bld: 109 mg/dL — ABNORMAL HIGH (ref 70–99)
Potassium: 4 mmol/L (ref 3.5–5.1)
Sodium: 139 mmol/L (ref 135–145)

## 2022-01-14 LAB — ECHOCARDIOGRAM COMPLETE
AR max vel: 3.69 cm2
AV Peak grad: 4.8 mmHg
Ao pk vel: 1.09 m/s
Area-P 1/2: 4.15 cm2
Calc EF: 57.8 %
S' Lateral: 3.1 cm
Single Plane A2C EF: 60.4 %
Single Plane A4C EF: 57.6 %

## 2022-01-14 MED ORDER — NICOTINE 14 MG/24HR TD PT24: 14.0000 mg | MEDICATED_PATCH | Freq: Every day | TRANSDERMAL | 1 refills | Status: DC

## 2022-01-14 MED ORDER — NICOTINE 14 MG/24HR TD PT24
14.0000 mg | MEDICATED_PATCH | Freq: Every day | TRANSDERMAL | 1 refills | Status: DC
  Filled 2022-01-14: qty 28, 28d supply, fill #0

## 2022-01-14 MED ORDER — ATORVASTATIN CALCIUM 40 MG PO TABS
40.0000 mg | ORAL_TABLET | Freq: Every day | ORAL | 2 refills | Status: DC
  Filled 2022-01-14: qty 30, 30d supply, fill #0

## 2022-01-14 MED ORDER — ALBUTEROL SULFATE HFA 108 (90 BASE) MCG/ACT IN AERS: 2.0000 | INHALATION_SPRAY | Freq: Four times a day (QID) | RESPIRATORY_TRACT | 2 refills | Status: DC | PRN

## 2022-01-14 MED ORDER — ALBUTEROL SULFATE HFA 108 (90 BASE) MCG/ACT IN AERS: 2.0000 | INHALATION_SPRAY | RESPIRATORY_TRACT | 0 refills | Status: DC | PRN

## 2022-01-14 MED ORDER — ASPIRIN 81 MG PO CHEW: 81.0000 mg | CHEWABLE_TABLET | Freq: Every day | ORAL | 2 refills | Status: DC

## 2022-01-14 MED ORDER — CLOPIDOGREL BISULFATE 75 MG PO TABS
75.0000 mg | ORAL_TABLET | Freq: Every day | ORAL | 0 refills | Status: DC
  Filled 2022-01-14: qty 19, 19d supply, fill #0

## 2022-01-14 MED ORDER — SPIRIVA RESPIMAT 2.5 MCG/ACT IN AERS: 2.0000 | INHALATION_SPRAY | Freq: Every day | RESPIRATORY_TRACT | 2 refills | Status: DC

## 2022-01-14 MED ORDER — ATORVASTATIN CALCIUM 40 MG PO TABS: 40.0000 mg | ORAL_TABLET | Freq: Every day | ORAL | 2 refills | Status: DC

## 2022-01-14 MED ORDER — ALBUTEROL SULFATE HFA 108 (90 BASE) MCG/ACT IN AERS
2.0000 | INHALATION_SPRAY | Freq: Four times a day (QID) | RESPIRATORY_TRACT | 2 refills | Status: DC | PRN
Start: 2022-01-14 — End: 2022-01-14
  Filled 2022-01-14: qty 8.5, 30d supply, fill #0

## 2022-01-14 MED ORDER — CLOPIDOGREL BISULFATE 75 MG PO TABS: 75.0000 mg | ORAL_TABLET | Freq: Every day | ORAL | 0 refills | Status: DC

## 2022-01-14 MED ORDER — SPIRIVA RESPIMAT 2.5 MCG/ACT IN AERS
2.0000 | INHALATION_SPRAY | Freq: Every day | RESPIRATORY_TRACT | 2 refills | Status: DC
  Filled 2022-01-14: qty 4, 30d supply, fill #0

## 2022-01-14 MED ORDER — ALBUTEROL SULFATE (2.5 MG/3ML) 0.083% IN NEBU: 2.5000 mg | INHALATION_SOLUTION | RESPIRATORY_TRACT | Status: DC | PRN

## 2022-01-14 MED ORDER — ASPIRIN 81 MG PO CHEW
81.0000 mg | CHEWABLE_TABLET | Freq: Every day | ORAL | 2 refills | Status: DC
  Filled 2022-01-14: qty 30, 30d supply, fill #0

## 2022-01-14 NOTE — Hospital Course (Signed)
[facility] for PCP?

## 2022-01-14 NOTE — Discharge Instructions (Addendum)
Julian Rogers were admitted for speech difficulties and shortness of breath and treated for transient ischemic attack (TIA or mini stroke).  Your brain imaging did not show evidence of a large stroke or bleeding in the brain.  You were evaluated by neurology who recommended a course of blood thinning medications and lifestyle recommendations to help prevent another TIA or major stroke.  We are discharging you home now that you are doing better. To help assist you on your road to recovery, I have written the following recommendations:   1.  The best thing you can do for your health is to quit smoking right away.  Your primary care doctor can help with this through counseling and medications.  You can also call 1-800-QUIT-NOW for confidential support to help you get and stay smoke-free.  2.  Your primary care physician will talk to you about lifestyle modifications that you can make to help prevent another TIA or major stroke.  These include limiting your alcohol intake to no more than 2 drinks per day, abstinence from illegal drugs, and adopting a healthy Mediterranean style diet.  3.  Please take your medications as prescribed.  For medications without an end date, please take them until your primary care physician tells you that you can stop: - Aspirin 81 mg daily - Clopidogrel (Plavix) 75 mg daily until February 02, 2022 - Atorvastatin (Lipitor) 40 mg daily  4.  Go to your hospital follow-up appointment with Dr. Rudene Christians of the internal medicine clinic at [address] in [place].  Your appointment has already been made for you on 01/21/2022 at 2:15 PM  Reasons to call your doctor or go to the emergency room include, but are not limited to: Balance changes, sudden changes to your hearing or vision, facial droop, arm or leg weakness on one side of your body, speech difficulties, extreme sleepiness, confusion, or disorientation.  These can all be signs of a TIA or major stroke.  Also, call  your doctor or go to the emergency room if you experience any severe chest pain or shortness of breath, as these can be signs of a heart attack.  It was a privilege to be a part of your hospital care team, and I hope you feel better as a result of your stay.  All the best in your recovery, Marrianne Mood, MD                   Intensive Outpatient Programs  [facility]    [facility] [address]     [address] [place],  [place]     [place], [place] [phone]      [phone]  [facility]   [facility]  (Inpatient and outpatient)  [phone] (Suboxone and Methadone) [address]           [phone]           [facility]    [facility] - Intensive Outpatient [address]     [address] [place], [place] [address]     [place], [place]  [phone]      [phone]  [facility] (Outpatient, Inpatient, Chemical  [facility] (Groups and Residental) (insurance only) [phone]    [place], [place]          [phone]       [facility]  [facility] (for caregivers and family) [address]     [address] [place]     [place], [place] [phone]      [phone]  Residential Treatment Programs  [facility]  [place](2 years) Residential: 90 days)  [facility] ([facility] [address] [facility]      [address] [place], [place]     [place], [place] [phone]      [phone] or [phone]  [facility]    [facility] [address]      [address] [place], [place]     [place], [place] [phone]      [phone]  [facility]   [facility] ([facility]) [address]     [address] [place], [place] [address]     [place],  [place] [phone]      [phone] Admissions: 8am-3pm M-F  [facility]: Residential Program [phone] Days)              [facility]: [facility] ([facility]  [place], [place]     [place], [place]  [phone] or [phone]    (Walk in Hours over the weekend or by referral)   [facility]: [facility]:[phone] (for crisis response 24 hours a day)

## 2022-01-14 NOTE — Discharge Summary (Addendum)
Name: Julian Rogers MRN: [id number] DOB: [birth year] 56 y.o. PCP: Pcp, No  Date of Admission: 01/13/2022  1:35 AM Date of Discharge: 01/14/2022 2:22 PM Attending Physician: Inez Catalina, MD  Discharge Diagnosis: Principal Problem:   TIA (transient ischemic attack) Active Problems:   Polysubstance use disorder   Dyspnea on exertion   Discharge Medications: Allergies as of 01/14/2022       Reactions   Penicillins Itching   Has patient had a PCN reaction causing immediate rash, facial/tongue/throat swelling, SOB or lightheadedness with hypotension: No Has patient had a PCN reaction causing severe rash involving mucus membranes or skin necrosis: No Has patient had a PCN reaction that required hospitalization No Has patient had a PCN reaction occurring within the last 10 years: No If all of the above answers are "NO", then may proceed with Cephalosporin use.   Doxycycline Rash        Medication List     STOP taking these medications    Aspirin-Acetaminophen 500-325 MG Pack   cetirizine 10 MG tablet Commonly known as: ZYRTEC   levofloxacin 500 MG tablet Commonly known as: LEVAQUIN   loperamide 2 MG capsule Commonly known as: IMODIUM   predniSONE 20 MG tablet Commonly known as: DELTASONE       TAKE these medications    albuterol 108 (90 Base) MCG/ACT inhaler Commonly known as: VENTOLIN HFA Inhale 2 puffs into the lungs every 6 (six) hours as needed for wheezing or shortness of breath. What changed: You were already taking a medication with the same name, and this prescription was added. Make sure you understand how and when to take each.   albuterol 108 (90 Base) MCG/ACT inhaler Commonly known as: VENTOLIN HFA Inhale 2 puffs into the lungs every 4 (four) hours as needed for wheezing or shortness of breath. What changed: Another medication with the same name was added. Make sure you understand how and when to take each.   aspirin 81 MG chewable tablet Chew 1  tablet (81 mg total) by mouth daily. Start taking on: January 15, 2022   atorvastatin 40 MG tablet Commonly known as: LIPITOR Take 1 tablet (40 mg total) by mouth daily. Start taking on: January 15, 2022   clopidogrel 75 MG tablet Commonly known as: PLAVIX Take 1 tablet (75 mg total) by mouth daily. Start taking on: January 15, 2022   nicotine 14 mg/24hr patch Commonly known as: NICODERM CQ - dosed in mg/24 hours Place 1 patch (14 mg total) onto the skin daily. Start taking on: January 15, 2022   Spiriva Respimat 2.5 MCG/ACT Aers Generic drug: Tiotropium Bromide Monohydrate Inhale 2 puffs into the lungs daily.        Disposition and follow-up:   Julian Rogers is a 56 y.o. year old male presented to the [facility] emergency room for speech difficulties and chest pain with dyspnea on exertion.  He was admitted for the treatment of transient ischemic attack.  They were are evaluated by neurology and DAPT was initiated.  His symptoms resolved soon after presentation.  His hospital course was uncomplicated, and he was discharged from [facility] on hospital day 0 in Good condition.  At the hospital follow up visit please address:  TIA - Plavix 75 mg daily until 02/02/2022 - Aspirin 81 mg daily indefinitely - Atorvastatin 40 mg daily - Encourage smoking cessation - Encourage abstinence from cocaine - Follow-up echocardiogram  Chest pain and dyspnea on exertion With several weeks  of sternal pressure and dyspnea worse on exertion and known cardiovascular disease given TIA, index of suspicion for CAD is high. - Outpatient stress test - Follow-up echocardiogram  Pulmonary nodule Found incidentally on CTA.  1 cm pulmonary nodule in the right upper lobe. - Repeat CT chest in 3 months  Labs / imaging needed at time of follow-up: - Stress test  Pending labs/ test needing follow-up: - Echocardiogram  Follow-up Appointments:  Follow-up Information      Masters, Joellen Jersey, DO. Go in 1 week(s).   Specialty: Internal Medicine Contact information: [place] [address] [phone]         [facility]. Schedule an appointment as soon as possible for a visit in 1 month(s).   Specialty: Neurology Why: stroke clinic Contact information: [place] Hospital Course by problem list:  TIA Patient had symptoms of dysarthria, altered mental status, and disequilibrium that lasted 15 to 20 minutes overnight on 01/13/2022.  Upon arrival to the emergency department he was hemodynamically stable.  CTA imaging revealed a small age-indeterminate infarct in the left cerebellum.  MRI did not reveal any new intracranial abnormalities.  Work-up was largely unremarkable except for the presence of cocaine metabolites on his urine toxicology.  Neurology was consulted and DAPT was initiated while in hospital.  Patient was also counseled about the importance of tobacco cessation.  Echocardiogram was ordered, the results pending on discharge.  The patient remained stable without recurrence of TIA symptoms and he was discharged on hospital day 0 in good condition.  Chest pain and dyspnea on exertion On arrival to the ED the patient endorsed chest pressure and shortness of breath associated with the onset of his TIA symptoms.  He also reports several weeks of worsening chest pressure and dyspnea with exertion.  An EKG did not reveal any signs of ischemia.  Troponins were negative at 0 and 2 hours after arrival.  The patient did not have chest pain or dyspnea on day of discharge.  He was counseled about smoking cessation in addition to other lifestyle changes prior to discharge and advised that he should follow-up with his primary care physician about this problem.  Incidentally found pulmonary nodule In the course of this patient's work-up for TIA and  chest pain a CTA chest revealed an isolated 1 cm pulmonary nodule in the right upper lung.  Review of this patient's prior encounters in the EMR turned up an old study that showed a right upper lung pulmonary nodule 8 mm in size.  Follow-up in the outpatient setting with a repeat CT chest in 3 months.  Discharge Exam:  Subjective: Patient feels well.  He denies difficulty speaking, asymmetric weakness, differences in sensation on one side of the body or the other, numbness, chest pain, shortness of breath.  He feels eager to go home.  Physical exam:   Blood pressure 116/71, pulse 69, temperature 97.7 F (36.5 C), temperature source Oral, resp. rate 18, SpO2 94 %. General: Male laying supine in bed in no apparent discomfort. 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  Normal work of breathing Abdominal: Soft nontender nondistended Extremities: No pedal edema.  Right radial pulse 2+ Skin: No rashes or lesions on visible skin Neuro: No pronator drift.  Bilateral upper extremity strength 5+.  Grip strength  equal.  Symmetric smile.  Tongue midline. Psych: Normal, appropriate mood and affect.  Pertinent studies and procedures:   Latest Reference Range & Units 01/14/22 04:45  Sodium 135 - 145 mmol/L 139  Potassium 3.5 - 5.1 mmol/L 4.0  Chloride 98 - 111 mmol/L 105  CO2 22 - 32 mmol/L 27  Glucose 70 - 99 mg/dL 109 (H)  BUN 6 - 20 mg/dL 14  Creatinine 0.61 - 1.24 mg/dL 1.20  Calcium 8.9 - 10.3 mg/dL 9.2  Anion gap 5 - 15  7  GFR, Estimated >60 mL/min >60  (H): Data is abnormally high   Latest Reference Range & Units 01/13/22 01:49  WBC 4.0 - 10.5 K/uL 6.4  RBC 4.22 - 5.81 MIL/uL 5.50  Hemoglobin 13.0 - 17.0 g/dL 16.5  HCT 39.0 - 52.0 % 47.3  MCV 80.0 - 100.0 fL 86.0  MCH 26.0 - 34.0 pg 30.0  MCHC 30.0 - 36.0 g/dL 34.9  RDW 11.5 - 15.5 % 14.1  Platelets 150 - 400 K/uL 337  nRBC 0.0 - 0.2 % 0.0     Latest Reference Range & Units 01/13/22 08:56  Total CHOL/HDL Ratio RATIO 3.4  Cholesterol 0 - 200 mg/dL 176  HDL Cholesterol >40 mg/dL 52  LDL (calc) 0 - 99 mg/dL 96  Triglycerides <150 mg/dL 141  VLDL 0 - 40 mg/dL 28    Latest Reference Range & Units 01/13/22 01:49 01/13/22 03:49  Troponin I (High Sensitivity) <18 ng/L 3 4   MRI brain without contrast: IMPRESSION: 1. No evidence of acute intracranial abnormality. 2. Small remote left cerebellar infarct.  CT angio head neck with without contrast: IMPRESSION: 1. Small age-indeterminate infarct in the left cerebellum. 2. Negative CTA.  CTA chest: IMPRESSION: 1. Worrisome 1 cm pulmonary nodule in the right upper lobe. Consider one of the following in 3 months for both low-risk and high-risk individuals: (a) repeat chest CT, (b) follow-up PET-CT, or (c) tissue sampling. This recommendation follows the consensus statement: Guidelines for Management of Incidental Pulmonary Nodules Detected on CT Images: From the Fleischner Society 2017; Radiology 2017; 284:228-243. 2. Negative for pulmonary embolism. 3. Emphysema.    Discharge Instructions      Julian Rogers were admitted for speech difficulties and shortness of breath and treated for transient ischemic attack (TIA or mini stroke).  Your brain imaging did not show evidence of a large stroke or bleeding in the brain.  You were evaluated by neurology who recommended a course of blood thinning medications and lifestyle recommendations to help prevent another TIA or major stroke.  We are discharging you home now that you are doing better. To help assist you on your road to recovery, I have written the following recommendations:   1.  The best thing you can do for your health is to quit smoking right away.  Your primary care doctor can help with this through counseling and medications.  You can also call 1-800-QUIT-NOW for confidential support to help you get and stay  smoke-free.  2.  Your primary care physician will talk to you about lifestyle modifications that you can make to help prevent another TIA or major stroke.  These include limiting your alcohol intake to no more than 2 drinks per day, abstinence from illegal drugs, and adopting a healthy Mediterranean style diet.  3.  Please take your medications as prescribed.  For medications without an end date, please take them until your primary care physician tells you that you can stop: -  Aspirin 81 mg daily - Clopidogrel (Plavix) 75 mg daily until February 02, 2022 - Atorvastatin (Lipitor) 40 mg daily  4.  Go to your hospital follow-up appointment with Dr. Rudene Christians of the internal medicine clinic at [address] in [place].  Your appointment has already been made for you on 01/21/2022 at 2:15 PM  Reasons to call your doctor or go to the emergency room include, but are not limited to: Balance changes, sudden changes to your hearing or vision, facial droop, arm or leg weakness on one side of your body, speech difficulties, extreme sleepiness, confusion, or disorientation.  These can all be signs of a TIA or major stroke.  Also, call your doctor or go to the emergency room if you experience any severe chest pain or shortness of breath, as these can be signs of a heart attack.  It was a privilege to be a part of your hospital care team, and I hope you feel better as a result of your stay.  All the best in your recovery, Marrianne Mood, MD    Signed: Marrianne Mood, MD 01/14/2022, 2:22 PM   Pager: [phone]

## 2022-01-14 NOTE — TOC Transition Note (Signed)
Transition of Care [facility]) - CM/SW Discharge Note   Patient Details  Name: Alain Deschene MRN: [id number] Date of Birth: [birth year]  [facility] CM/SW Contact:  Harriet Masson, RN Phone Number: 01/14/2022, 4:56 PM   Clinical Narrative:    Patient stable for discharge. Patient states he does have insurance. PCP apt made and placed on AVS. CSW placed substance abuse resources on AVS.  Final next level of care: Home/Self Care Barriers to Discharge: Barriers Resolved   Patient Goals and CMS Choice Patient states their goals for this hospitalization and ongoing recovery are:: go home      Discharge Placement                       Discharge Plan and Services   Discharge Planning Services: Follow-up appt scheduled                                 Social Determinants of Health (SDOH) Interventions     Readmission Risk Interventions     No data to display

## 2022-01-14 NOTE — Evaluation (Signed)
Physical Therapy Evaluation & Discharge Patient Details Name: Julian Rogers MRN: [id number] DOB: 12-02-1965 Today's Date: 01/14/2022  History of Present Illness  Pt is a 56 y.o. M admitted on 01/13/22 due to aphasia, chest pain, and diaphoresis. MRI negative. PMH significant for R eye blindness.   Clinical Impression  Pt presented supine in bed with HOB elevated, awake and willing to participate in therapy session. Pt's spouse present throughout evaluation. Prior to admission, pt reported that he was independent with all functional mobility and ADLs. Pt lives with his wife in a single level home with several steps to enter. At the time of evaluation, pt overall at a supervision to independent level with all functional mobility including hallway ambulation without the need for an assistive device. 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 Pt reported that he feels he is at his baseline in regards to functional mobility. Additionally, PT reviewed "BE FAST" with pt and spouse to be aware of signs/symptoms of a stroke. Both expressed understanding. No further acute PT needs identified at this time. PT signing off.        Recommendations for follow up therapy are one component of a multi-disciplinary discharge planning process, led by the attending physician.  Recommendations may be updated based on patient status, additional functional criteria and insurance authorization.  Follow Up Recommendations No PT follow up      Assistance Recommended at Discharge None  Patient can return home with the following  Other (comment) (n/a)    Equipment Recommendations None recommended by PT  Recommendations for Other Services       Functional Status Assessment Patient has not had a recent decline in their functional status     Precautions / Restrictions Precautions Precautions: None Restrictions Weight Bearing  Restrictions: No      Mobility  Bed Mobility Overal bed mobility: Independent                  Transfers Overall transfer level: Independent                      Ambulation/Gait Ambulation/Gait assistance: Supervision Gait Distance (Feet): 250 Feet Assistive device: None Gait Pattern/deviations: Step-through pattern Gait velocity: able to fluctuate     General Gait Details: pt steady overall with no LOB or difficulties noted  Stairs            Wheelchair Mobility    Modified Rankin (Stroke Patients Only) Modified Rankin (Stroke Patients Only) Pre-Morbid Rankin Score: No symptoms Modified Rankin: No symptoms     Balance                                 Standardized Balance Assessment Standardized Balance Assessment : Dynamic Gait Index   Dynamic Gait Index Level Surface: Normal Change in Gait Speed: Normal Gait with Horizontal Head Turns: Normal Gait with Vertical Head Turns: Normal Gait and Pivot Turn: Normal Step Over Obstacle: Normal Step Around Obstacles: Normal Steps: Normal Total Score: 24       Pertinent Vitals/Pain Pain Assessment Pain Assessment: No/denies pain    Home Living Family/patient expects to be discharged to:: Private residence Living Arrangements: Spouse/significant other Available Help at Discharge: Family;Available 24 hours/day Type of Home: Apartment Home Access: Stairs to enter Entrance Stairs-Rails: Right;Left Entrance Stairs-Number of Steps: 3 flights  Home Layout: One level Home Equipment: None      Prior Function Prior Level of Function : Independent/Modified Independent;Working/employed;Driving                     Hand Dominance   Dominant Hand: Right    Extremity/Trunk Assessment   Upper Extremity Assessment Upper Extremity Assessment: Defer to OT evaluation;Overall [facility] for tasks assessed    Lower Extremity Assessment Lower Extremity Assessment: Overall WFL for  tasks assessed    Cervical / Trunk Assessment Cervical / Trunk Assessment: Normal  Communication   Communication: No difficulties  Cognition Arousal/Alertness: Awake/alert Behavior During Therapy: WFL for tasks assessed/performed Overall Cognitive Status: Within Functional Limits for tasks assessed                                          General Comments      Exercises     Assessment/Plan    PT Assessment Patient does not need any further PT services  PT Problem List         PT Treatment Interventions      PT Goals (Current goals can be found in the Care Plan section)  Acute Rehab PT Goals Patient Stated Goal: "home today" PT Goal Formulation: All assessment and education complete, DC therapy    Frequency       Co-evaluation               AM-PAC PT "6 Clicks" Mobility  Outcome Measure Help needed turning from your back to your side while in a flat bed without using bedrails?: None Help needed moving from lying on your back to sitting on the side of a flat bed without using bedrails?: None Help needed moving to and from a bed to a chair (including a wheelchair)?: None Help needed standing up from a chair using your arms (e.g., wheelchair or bedside chair)?: None Help needed to walk in hospital room?: None Help needed climbing 3-5 steps with a railing? : None 6 Click Score: 24    End of Session   Activity Tolerance: Patient tolerated treatment well Patient left: in bed;with call bell/phone within reach;with family/visitor present Nurse Communication: Mobility status PT Visit Diagnosis: Other symptoms and signs involving the nervous system (F62.130)    Time: 8657-8469 PT Time Calculation (min) (ACUTE ONLY): 14 min   Charges:   PT Evaluation $PT Eval Low Complexity: 1 Low          Ginette Pitman, PT, DPT  Acute Rehabilitation Services Office [phone]   Alessandra Bevels Kenishia Plack 01/14/2022, 9:23 AM

## 2022-01-14 NOTE — Progress Notes (Addendum)
STROKE TEAM PROGRESS NOTE   INTERVAL HISTORY Wife is at the bedside. Pt sitting at the edge of the bed, on the phone. No aucte event overnight and no complains. Neuro intact except chronic right eye blind. Willing to quit smoking, illicit drugs. Willing to cut down alcohol. Pending TTE.  Vitals:   01/14/22 0000 01/14/22 0405 01/14/22 0802 01/14/22 1145  BP: 129/75 121/75 125/72 116/71  Pulse: 76 80 74 69  Resp: 18 18 20 18   Temp: (!) 97.5 F (36.4 C) 97.8 F (36.6 C) 97.7 F (36.5 C) 97.7 F (36.5 C)  TempSrc: Oral Oral Oral Oral  SpO2: 94% 98% 95% 94%   CBC:  Recent Labs  Lab 01/13/22 0149  WBC 6.4  HGB 16.5  HCT 47.3  MCV 86.0  PLT 337   Basic Metabolic Panel:  Recent Labs  Lab 01/13/22 0149 01/14/22 0445  NA 136 139  K 3.4* 4.0  CL 100 105  CO2 20* 27  GLUCOSE 122* 109*  BUN 13 14  CREATININE 1.20 1.20  CALCIUM 9.5 9.2   Lipid Panel:  Recent Labs  Lab 01/13/22 0856  CHOL 176  TRIG 141  HDL 52  CHOLHDL 3.4  VLDL 28  LDLCALC 96   HgbA1c:  Recent Labs  Lab 01/13/22 0856  HGBA1C 5.6   Urine Drug Screen:  Recent Labs  Lab 01/13/22 0536  LABOPIA NONE DETECTED  COCAINSCRNUR POSITIVE*  LABBENZ NONE DETECTED  AMPHETMU NONE DETECTED  THCU NONE DETECTED  LABBARB NONE DETECTED    Alcohol Level No results for input(s): "ETH" in the last 168 hours.  IMAGING past 24 hours No results found.  PHYSICAL EXAM General:  Alert, well-nourished, well-developed patient in no acute distress Respiratory:  Regular, unlabored respirations on room  air  NEURO:  Mental Status: AA&Ox3  Speech/Language: speech is without dysarthria or aphasia.  Fluency, and comprehension intact.  Cranial Nerves:  II: Right eye chronically blind   III, IV, VI: right eye ptosis with limited eye movement, EOMI in left eye V: Sensation is intact to light touch and symmetrical to face.  VII: Smile is symmetrical.   VIII: hearing intact to voice. IX, X: Phonation is normal.  XII:  tongue is midline without fasciculations. Motor: 5/5 strength to all muscle groups tested.  Tone: is normal and bulk is normal Sensation- Intact to light touch bilaterally.  Coordination: FTN intact bilaterally.No drift.  Gait- deferred     ASSESSMENT/PLAN Mr. Debra Calabretta is a 56 y.o. 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  His MRI was negative for acute stroke, and CTA showed no LVO or significant stenosis.  Episode may have been in  part a TIA.  TIA:  likely left brain TIA due to multiple risk factors including smoker, alcohol abuse, and cocaine use CT head No acute abnormality. ? Age-indeterminate left cerebellar infarct  CTA head & neck no LVO or significant stenosis MRI  remote small left cerebellar infarct, no acute abnormality 2D Echo EF 55-60% LDL 96 HgbA1c 5.6 UDS positive for cocaine VTE prophylaxis - lovenox No antithrombotic prior to admission, now on aspirin 81 mg daily and clopidogrel 75 mg daily DAPT for 3 weeks and then ASA alone. Therapy recommendations:  pending Disposition:  pending  Hypertension Home meds:  none Stable Long-term BP goal normotensive  Hyperlipidemia Home meds:  none LDL 96, goal < 70 Add atorvastatin 40 mg daily  Continue statin at discharge  Tobacco abuse Current smoker Smoking cessation counseling provided Nicotine patch provided Pt is willing to quit  Cocaine abuse UDS positive for cocaine Cessation education provided Pt is willing to quit  Other Stroke Risk Factors Alcohol abuse, 40oz beer daily, educated on limitation of alcohol use.  No more than 2 drinks per day.  Other Active Problems Chronic right eye blind since infancy CP/SOB for months - management per primary team  Hospital day #  0  Neurology will sign off. Please call with questions. Pt will follow up with stroke clinic NP at [facility] in about 4 weeks. Thanks for the consult.   Marvel Plan, MD PhD Stroke Neurology 01/14/2022 2:15 PM      To contact Stroke Continuity provider, please refer to [email or web address]. After hours, contact General Neurology

## 2022-01-14 NOTE — Social Work (Signed)
CSW acknowledges consult for SU counseling. CSW spoke with pt and he noted interest in cessation and resources. Resources added to pt's AVS for convenience. Please consult TOC for any further needs.

## 2022-01-16 ENCOUNTER — Other Ambulatory Visit: Payer: Self-pay | Admitting: Student

## 2022-01-19 ENCOUNTER — Encounter (HOSPITAL_COMMUNITY): Payer: Self-pay | Admitting: Emergency Medicine

## 2022-01-19 ENCOUNTER — Ambulatory Visit (HOSPITAL_COMMUNITY)
Admission: EM | Admit: 2022-01-19 | Discharge: 2022-01-19 | Disposition: A | Discharge status: Other Acute Inpatient Hospital | Payer: BC Managed Care – PPO | Attending: Internal Medicine | Admitting: Internal Medicine

## 2022-01-19 ENCOUNTER — Emergency Department (HOSPITAL_COMMUNITY)
Admission: EM | Admit: 2022-01-19 | Discharge: 2022-01-19 | Discharge status: Against Medical Advice | Payer: BC Managed Care – PPO | Attending: Emergency Medicine | Admitting: Emergency Medicine

## 2022-01-19 ENCOUNTER — Encounter (HOSPITAL_COMMUNITY): Payer: Self-pay

## 2022-01-19 ENCOUNTER — Emergency Department (HOSPITAL_COMMUNITY): Payer: BC Managed Care – PPO

## 2022-01-19 DIAGNOSIS — R519 Headache, unspecified: Secondary | ICD-10-CM | POA: Diagnosis not present

## 2022-01-19 DIAGNOSIS — Z5321 Procedure and treatment not carried out due to patient leaving prior to being seen by health care provider: Secondary | ICD-10-CM | POA: Diagnosis not present

## 2022-01-19 DIAGNOSIS — R0602 Shortness of breath: Secondary | ICD-10-CM | POA: Diagnosis not present

## 2022-01-19 DIAGNOSIS — R6883 Chills (without fever): Secondary | ICD-10-CM

## 2022-01-19 DIAGNOSIS — R7981 Abnormal blood-gas level: Secondary | ICD-10-CM

## 2022-01-19 HISTORY — DX: Transient cerebral ischemic attack, unspecified: G45.9

## 2022-01-19 LAB — CBC WITH DIFFERENTIAL/PLATELET
Abs Immature Granulocytes: 0.04 10*3/uL (ref 0.00–0.07)
Basophils Absolute: 0 10*3/uL (ref 0.0–0.1)
Basophils Relative: 0 %
Eosinophils Absolute: 0.3 10*3/uL (ref 0.0–0.5)
Eosinophils Relative: 3 %
HCT: 45.2 % (ref 39.0–52.0)
Hemoglobin: 14.8 g/dL (ref 13.0–17.0)
Immature Granulocytes: 0 %
Lymphocytes Relative: 16 %
Lymphs Abs: 1.6 10*3/uL (ref 0.7–4.0)
MCH: 29.7 pg (ref 26.0–34.0)
MCHC: 32.7 g/dL (ref 30.0–36.0)
MCV: 90.8 fL (ref 80.0–100.0)
Monocytes Absolute: 0.8 10*3/uL (ref 0.1–1.0)
Monocytes Relative: 8 %
Neutro Abs: 7.3 10*3/uL (ref 1.7–7.7)
Neutrophils Relative %: 73 %
Platelets: 248 10*3/uL (ref 150–400)
RBC: 4.98 MIL/uL (ref 4.22–5.81)
RDW: 14.3 % (ref 11.5–15.5)
WBC: 10 10*3/uL (ref 4.0–10.5)
nRBC: 0 % (ref 0.0–0.2)

## 2022-01-19 LAB — COMPREHENSIVE METABOLIC PANEL
ALT: 30 U/L (ref 0–44)
AST: 21 U/L (ref 15–41)
Albumin: 3.6 g/dL (ref 3.5–5.0)
Alkaline Phosphatase: 51 U/L (ref 38–126)
Anion gap: 13 (ref 5–15)
BUN: 8 mg/dL (ref 6–20)
CO2: 23 mmol/L (ref 22–32)
Calcium: 9.2 mg/dL (ref 8.9–10.3)
Chloride: 102 mmol/L (ref 98–111)
Creatinine, Ser: 1.11 mg/dL (ref 0.61–1.24)
GFR, Estimated: >60 mL/min (ref >60)
Glucose, Bld: 112 mg/dL — ABNORMAL HIGH (ref 70–99)
Potassium: 3.7 mmol/L (ref 3.5–5.1)
Sodium: 138 mmol/L (ref 135–145)
Total Bilirubin: 0.9 mg/dL (ref 0.3–1.2)
Total Protein: 7.3 g/dL (ref 6.5–8.1)

## 2022-01-19 MED ORDER — ALBUTEROL SULFATE HFA 108 (90 BASE) MCG/ACT IN AERS: 2.0000 | INHALATION_SPRAY | RESPIRATORY_TRACT | Status: DC | PRN

## 2022-01-19 MED ORDER — IPRATROPIUM-ALBUTEROL 0.5-2.5 (3) MG/3ML IN SOLN
3.0000 mL | Freq: Once | RESPIRATORY_TRACT | Status: AC
  Administered 2022-01-19: 3 mL via RESPIRATORY_TRACT
  Filled 2022-01-19: qty 3

## 2022-01-19 NOTE — Discharge Instructions (Addendum)
Patient sent to hospital via [facility].

## 2022-01-19 NOTE — ED Notes (Signed)
Called charge nurse phone to give report, no answer.

## 2022-01-19 NOTE — ED Notes (Signed)
Patient is being discharged from the Urgent Care and sent to the Emergency Department via EMS . Per Laren Everts FNP, patient is in need of higher level of care due to SOB. Patient is aware and verbalizes understanding of plan of care.  Vitals:   01/19/22 1627 01/19/22 1628  BP: 119/74   Pulse: 98   Resp: 16   Temp: 98.2 F (36.8 C)   SpO2: 92% 92%

## 2022-01-19 NOTE — ED Notes (Signed)
IV removed per PT request

## 2022-01-19 NOTE — ED Triage Notes (Addendum)
From [facility] UC with SOB with exertion. UC placed him on 2 L [place] due to fact he was sating 90%. No COPD but chronic bronchitis. Was just worked up fully for SOB a little while ago. RA sats 94%. Tech ambulated and sats dropped to 87%.

## 2022-01-19 NOTE — Progress Notes (Signed)
RT instructed patient and family member on the use of a flutter valve. Patient was able to demonstrate back good technique.

## 2022-01-19 NOTE — ED Notes (Signed)
PT opted to leave with wife

## 2022-01-19 NOTE — ED Notes (Signed)
94% on RA 

## 2022-01-19 NOTE — ED Notes (Signed)
Pt dropped to 87% on ambulation.

## 2022-01-19 NOTE — ED Provider Notes (Signed)
[facility]    CSN: [id number] Arrival date & time: 01/19/22  1611      History   Chief Complaint Chief Complaint  Patient presents with   Shortness of Breath    HPI Julian Rogers is a 56 y.o. male.   Patient presents with shortness of breath, dizziness, headaches, cold sweats, chills that has been present for multiple days.  Patient was admitted to the hospital on 7/9 for TIA.  He was started on Plavix and atorvastatin and states that he has not felt well since starting these medications and being discharged from the hospital.  Denies any associated upper respiratory symptoms, cough, chest pain, fever, nausea, vomiting, diarrhea, abdominal pain.  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  Patient also complaining of generalized weakness.   Shortness of Breath   Past Medical History:  Diagnosis Date   Blind right eye    Newborn infection resuling in vision loss   Chronic shoulder pain    TIA (transient ischemic attack)     Patient Active Problem List   Diagnosis Date Noted   TIA (transient ischemic attack) 01/13/2022   Polysubstance use disorder 01/13/2022   Dyspnea on exertion 01/13/2022   History of COVID-19 03/01/2021   Tachycardia 03/01/2021   Blindness of right eye with normal vision in contralateral eye 03/01/2021   BMI 23.0-23.9, adult 03/01/2021   Tobacco user 04/10/2012   Chronic shoulder pain 04/10/2012    Past Surgical History:  Procedure Laterality Date   EYE SURGERY     right eye at birth        Home Medications    Prior to Admission medications   Medication Sig Start Date End Date Taking? Authorizing Provider  albuterol (VENTOLIN HFA) 108 (90 Base) MCG/ACT inhaler Inhale 2 puffs into the lungs every 6 (six) hours as needed for wheezing or shortness of breath. 01/14/22   Merrilyn Puma, MD  albuterol (VENTOLIN HFA) 108 (90 Base) MCG/ACT inhaler Inhale 2 puffs into the lungs every 4  (four) hours as needed for wheezing or shortness of breath. 01/14/22   Merrilyn Puma, MD  aspirin 81 MG chewable tablet Chew 1 tablet (81 mg total) by mouth daily. 01/15/22   Merrilyn Puma, MD  atorvastatin (LIPITOR) 40 MG tablet Take 1 tablet (40 mg total) by mouth daily. 01/15/22   Merrilyn Puma, MD  clopidogrel (PLAVIX) 75 MG tablet Take 1 tablet (75 mg total) by mouth daily. 01/15/22   Merrilyn Puma, MD  nicotine (NICODERM CQ - DOSED IN MG/24 HOURS) 14 mg/24hr patch Place 1 patch (14 mg total) onto the skin daily. 01/15/22   Merrilyn Puma, MD  Tiotropium Bromide Monohydrate (SPIRIVA RESPIMAT) 2.5 MCG/ACT AERS Inhale 2 puffs into the lungs daily. 01/14/22   Merrilyn Puma, MD    Family History Family History  Problem Relation Age of Onset   Cancer Mother     Social History Social History   Tobacco Use   Smoking status: Every Day    Packs/day: 0.50    Years: 25.00    Total pack years: 12.50    Types: Cigarettes   Smokeless tobacco: Never  Substance Use Topics   Alcohol use: Yes    Comment: 3-40 ounces   Drug use: No     Allergies   Penicillins and Doxycycline   Review of Systems Review of Systems Per HPI  Physical Exam Triage Vital Signs ED Triage Vitals  Enc Vitals  Group     BP 01/19/22 1627 119/74     Pulse Rate 01/19/22 1627 98     Resp 01/19/22 1627 16     Temp 01/19/22 1627 98.2 F (36.8 C)     Temp Source 01/19/22 1627 Oral     SpO2 01/19/22 1627 92 %     Weight --      Height --      Head Circumference --      Peak Flow --      Pain Score 01/19/22 1628 0     Pain Loc --      Pain Edu? --      Excl. in GC? --    No data found.  Updated Vital Signs BP 119/74 (BP Location: Right Arm)   Pulse 98   Temp 98.2 F (36.8 C) (Oral)   Resp 16   SpO2 92%   Visual Acuity Right Eye Distance:   Left Eye Distance:   Bilateral Distance:    Right Eye Near:   Left Eye Near:    Bilateral Near:     Physical Exam Constitutional:      General: He is  not in acute distress.    Appearance: Normal appearance. He is not toxic-appearing or diaphoretic.  HENT:     Head: Normocephalic and atraumatic.  Eyes:     Extraocular Movements: Extraocular movements intact.     Conjunctiva/sclera: Conjunctivae normal.     Pupils: Pupils are equal, round, and reactive to light.     Comments: Prosthetic eye right eye  Cardiovascular:     Rate and Rhythm: Normal rate and regular rhythm.     Pulses: Normal pulses.     Heart sounds: Normal heart sounds.  Pulmonary:     Effort: Pulmonary effort is normal. No respiratory distress.     Breath sounds: Normal breath sounds.  Abdominal:     General: Bowel sounds are normal. There is no distension.     Palpations: Abdomen is soft.     Tenderness: There is no abdominal tenderness.  Neurological:     General: No focal deficit present.     Mental Status: He is alert and oriented to person, place, and time. Mental status is at baseline.     Cranial Nerves: Cranial nerves 2-12 are intact.     Sensory: Sensation is intact.     Motor: Motor function is intact.     Coordination: Coordination is intact.     Gait: Gait is intact.  Psychiatric:        Mood and Affect: Mood normal.        Behavior: Behavior normal.        Thought Content: Thought content normal.        Judgment: Judgment normal.      UC Treatments / Results  Labs (all labs ordered are listed, but only abnormal results are displayed) Labs Reviewed - No data to display  EKG   Radiology No results found.  Procedures Procedures (including critical care time)  Medications Ordered in UC Medications - No data to display  Initial Impression / Assessment and Plan / UC Course  I have reviewed the triage vital signs and the nursing notes.  Pertinent labs & imaging results that were available during my care of the patient were reviewed by me and considered in my medical decision making (see chart for details).     Patient's oxygen  saturation ranging from 87 to 92% during initial triage and  physical exam.  Patient placed on 2 L oxygen.  Given patient's recent hospital admission for TIA with associated shortness of breath, dizziness, chills, generalized weakness, I do think this warrants further evaluation and management at the hospital given limited resources here in urgent care.  Patient was agreeable with plan and left via EMS transport. Final Clinical Impressions(s) / UC Diagnoses   Final diagnoses:  SOB (shortness of breath)  Low oxygen saturation  Chills (without fever)  Acute nonintractable headache, unspecified headache type     Discharge Instructions      Patient sent to hospital via [facility].    ED Prescriptions   None    PDMP not reviewed this encounter.   Gustavus Bryant, [place] 01/19/22 [phone]

## 2022-01-19 NOTE — ED Triage Notes (Signed)
Pt states he was in the hospital last week for a TIA was started on Plavix and Atorvastatin, states since he started these medications he has been having  SOB with exertion, dizziness, headaches and cold sweats. States he took a [place] one hour ago.

## 2022-01-19 NOTE — ED Notes (Signed)
Report to [facility] RN CN.

## 2022-01-20 NOTE — Progress Notes (Deleted)
New patient HFU  Mr. Julian Rogers is a 56yo with PMH of CVA, polysubstance use disorder who was admitted from 7/9-7/10 due to TIA.  TIA Clopidogrel until 7/29 Echo showed EF 55-60% with no regional wall motion abnormalities F/u w neuro  Chest pain and dyspnea on exertion Outpatient stress test Echo with normal EF  Hx of CVA MRI showed remote infarct of left cerebellum ASA 81, atorvastatin 40, clopidogrel 75  Pulmonary nodule Repeat CT chest in October  Tobacco cessation Nicotine patch  COPD CT consistent with Emphysema. No prior PFTs on chart review  ****seen at urgent care 7/15 with SOB and gen weakness and sent to ED due desaturating**** CXR showed bilateral interstitial edema with no focal pulm consolidation

## 2022-01-21 ENCOUNTER — Ambulatory Visit: Payer: Self-pay | Admitting: Internal Medicine

## 2022-01-24 ENCOUNTER — Ambulatory Visit: Payer: Self-pay | Admitting: Internal Medicine

## 2023-03-31 ENCOUNTER — Ambulatory Visit (HOSPITAL_COMMUNITY)
Admission: EM | Admit: 2023-03-31 | Discharge: 2023-03-31 | Disposition: A | Discharge status: Home / Self Care | Payer: Self-pay | Attending: Internal Medicine | Admitting: Internal Medicine

## 2023-03-31 ENCOUNTER — Encounter (HOSPITAL_COMMUNITY): Payer: Self-pay

## 2023-03-31 DIAGNOSIS — L02416 Cutaneous abscess of left lower limb: Secondary | ICD-10-CM

## 2023-03-31 MED ORDER — SULFAMETHOXAZOLE-TRIMETHOPRIM 800-160 MG PO TABS: 1.0000 | ORAL_TABLET | Freq: Two times a day (BID) | ORAL | 0 refills | Status: AC

## 2023-03-31 MED ORDER — LIDOCAINE-EPINEPHRINE 1 %-1:100000 IJ SOLN
INTRAMUSCULAR | Status: AC
  Filled 2023-03-31: qty 1

## 2023-03-31 NOTE — Discharge Instructions (Signed)
We drained your abscess today in the clinic and left open to continue to drain.  Continue to use warm compresses to the wound to further encourage drainage from the wound.  You may do this in the shower as well with gentle compresses to the area to allow more infected material to drain.   Take antibiotic as prescribed to treat infection to the abscess.   Change your dressings frequently as the wound heals.   You may take over the counter pain medicines as needed for pain once the numbing wears off.  If you notice any worsening signs of infection such as redness, swelling, fever, or worsening drainage, please return to urgent care for reevaluation.

## 2023-03-31 NOTE — ED Triage Notes (Signed)
Pt is here for insect bite to his left calf x 3 days. Pt reports swelling and pain.

## 2023-03-31 NOTE — ED Provider Notes (Signed)
[facility]    CSN: [id number] Arrival date & time: 03/31/23  1806      History   Chief Complaint Chief Complaint  Patient presents with   Insect Bite    HPI Julian Rogers is a 57 y.o. male.   Julian Rogers is a 57 y.o. male presenting for chief complaint of possible insect bite to the left lateral thigh that started approximately 6 days ago.  He first noticed the area as a "bump" to the left lateral thigh that has grown significantly in size and tenderness over the last 6 days.  He was able to pull out an unknown substance from the bump when he first noticed it but did not look at what he was able to pull out of the bump.  He is unsure of any recent insect bites or ticks bites.  No recent fevers, chills, body aches, numbness/tingling distally to cutaneous abscess, or injuries to the left thigh.  Abscess started to drain yesterday overnight and he states this helped a lot with the swelling but pain and swelling remain.  Drainage has appeared white in color and is foul-smelling.  No recent antibiotic/steroid use.  No history of immunosuppression.  Using over-the-counter medications to help with pain without much relief.     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 Authorizing Provider  sulfamethoxazole-trimethoprim (BACTRIM DS) 800-160 MG tablet Take 1 tablet by mouth 2 (two) times daily for 7 days. 03/31/23 04/07/23 Yes Carlisle Beers, FNP  albuterol (VENTOLIN HFA) 108 (90 Base) MCG/ACT inhaler Inhale 2 puffs into the lungs every 6 (six) hours as needed for wheezing or shortness of breath. 01/14/22   Merrilyn Puma, MD  albuterol (VENTOLIN HFA) 108 (90 Base) MCG/ACT inhaler Inhale 2 puffs into the lungs every 4 (four) hours as needed for wheezing or shortness of breath. 01/14/22   Merrilyn Puma, MD  aspirin 81 MG chewable tablet Chew 1 tablet (81 mg total) by mouth daily. 01/15/22   Merrilyn Puma, MD  atorvastatin (LIPITOR) 40 MG tablet Take 1 tablet (40 mg total) by mouth daily. 01/15/22   Merrilyn Puma, MD  clopidogrel (PLAVIX) 75 MG tablet Take 1 tablet (75 mg total) by mouth daily. 01/15/22   Merrilyn Puma, MD  nicotine (NICODERM CQ - DOSED IN MG/24 HOURS) 14 mg/24hr patch Place 1 patch (14 mg total) onto the skin daily. 01/15/22   Merrilyn Puma, MD  Tiotropium Bromide Monohydrate (SPIRIVA RESPIMAT) 2.5 MCG/ACT AERS Inhale 2 puffs into the lungs daily. 01/14/22   Merrilyn Puma, MD    Family History Family History  Problem Relation Age of Onset   Cancer Mother  Social History Social History   Tobacco Use   Smoking status: Every Day    Current packs/day: 0.50    Average packs/day: 0.5 packs/day for 25.0 years (12.5 ttl pk-yrs)    Types: Cigarettes   Smokeless tobacco: Never  Substance Use Topics   Alcohol use: Yes    Comment: 3-40 ounces   Drug use: No     Allergies   Penicillins and Doxycycline   Review of Systems Review of Systems Per HPI  Physical Exam Triage Vital Signs ED Triage Vitals [03/31/23 1912]  Encounter Vitals Group     BP 121/81     Systolic BP Percentile      Diastolic BP Percentile      Pulse Rate 92     Resp 16     Temp 98.4 F (36.9 C)     Temp Source Oral     SpO2 98 %      Weight      Height      Head Circumference      Peak Flow      Pain Score      Pain Loc      Pain Education      Exclude from Growth Chart    No data found.  Updated Vital Signs BP 121/81 (BP Location: Left Arm)   Pulse 92   Temp 98.4 F (36.9 C) (Oral)   Resp 16   SpO2 98%   Visual Acuity Right Eye Distance:   Left Eye Distance:   Bilateral Distance:    Right Eye Near:   Left Eye Near:    Bilateral Near:     Physical Exam Vitals and nursing note reviewed.  Constitutional:      Appearance: He is not ill-appearing or toxic-appearing.  HENT:     Head: Normocephalic and atraumatic.     Right Ear: Hearing and external ear normal.     Left Ear: Hearing and external ear normal.     Nose: Nose normal.     Mouth/Throat:     Lips: Pink.  Eyes:     General: Lids are normal. Vision grossly intact. Gaze aligned appropriately.     Extraocular Movements: Extraocular movements intact.     Conjunctiva/sclera: Conjunctivae normal.  Pulmonary:     Effort: Pulmonary effort is normal.  Musculoskeletal:     Cervical back: Neck supple.  Skin:    General: Skin is warm and dry.     Capillary Refill: Capillary refill takes less than 2 seconds.     Findings: Abscess present. No rash.       Neurological:     General: No focal deficit present.     Mental Status: He is alert and oriented to person, place, and time. Mental status is at baseline.     Cranial Nerves: No dysarthria or facial asymmetry.  Psychiatric:        Mood and Affect: Mood normal.        Speech: Speech normal.        Behavior: Behavior normal.        Thought Content: Thought content normal.        Judgment: Judgment normal.      UC Treatments / Results  Labs (all labs ordered are listed, but only abnormal results are displayed) Labs Reviewed - No data to display  EKG   Radiology No results found.  Procedures Incision and Drainage  Date/Time: 03/31/2023 8:43 PM  Performed by: Carlisle Beers, FNP  Authorized by: Carlisle Beers, FNP   Consent:    Consent obtained:  Verbal   Consent given by:  Patient   Risks, benefits, and alternatives were discussed: yes     Risks discussed:  Bleeding, damage to other organs, incomplete drainage, infection and pain   Alternatives discussed:  No treatment Universal protocol:    Patient identity confirmed:  Verbally with patient Location:    Type:  Abscess   Size:  2cm   Location:  Lower extremity   Lower extremity location:  Leg   Leg location:  L upper leg Pre-procedure details:    Skin preparation:  Chlorhexidine with alcohol and povidone-iodine Sedation:    Sedation type:  None Anesthesia:    Anesthesia method:  Local infiltration   Local anesthetic:  Lidocaine 1% WITH epi Procedure type:    Complexity:  Complex Procedure details:    Incision types:  Stab incision   Incision depth:  Dermal   Wound management:  Probed and deloculated and irrigated with saline   Drainage:  Bloody and purulent   Drainage amount:  Moderate   Wound treatment:  Wound left open   Packing materials:  None Post-procedure details:    Procedure completion:  Tolerated well, no immediate complications  (including critical care time)  Medications Ordered in UC Medications - No data to display  Initial Impression / Assessment and Plan / UC Course  I have reviewed the triage vital signs and the nursing notes.  Pertinent labs & imaging results that were available during my care of the patient were reviewed by me and considered in my medical decision making (see chart for details).   1. Abscess of left thigh See incision and drainage note above for further detail regarding incision and drainage procedure, patient tolerated well.  Wound cleansed and dressed in clinic. Wound care discussed. Warm compresses recommended to allow wound to drain further.  Bactrim antibiotic as prescribed (patient is allergic to penicillin and doxycycline).  Tylenol may be  used as needed for pain once numbing wears off.   Counseled patient on potential for adverse effects with medications prescribed/recommended today, strict ER and return-to-clinic precautions discussed, patient verbalized understanding.    Final Clinical Impressions(s) / UC Diagnoses   Final diagnoses:  Abscess of left thigh     Discharge Instructions      We drained your abscess today in the clinic and left open to continue to drain.  Continue to use warm compresses to the wound to further encourage drainage from the wound.  You may do this in the shower as well with gentle compresses to the area to allow more infected material to drain.   Take antibiotic as prescribed to treat infection to the abscess.   Change your dressings frequently as the wound heals.   You may take over the counter pain medicines as needed for pain once the numbing wears off.  If you notice any worsening signs of infection such as redness, swelling, fever, or worsening drainage, please return to urgent care for reevaluation.      ED Prescriptions     Medication Sig Dispense Auth. Provider   sulfamethoxazole-trimethoprim (BACTRIM DS) 800-160 MG tablet Take 1 tablet by mouth 2 (two) times daily for 7 days. 14 tablet Carlisle Beers, FNP      PDMP not reviewed this encounter.   Carlisle Beers, [place] 03/31/23 2045

## 2023-10-08 DIAGNOSIS — R61 Generalized hyperhidrosis: Secondary | ICD-10-CM | POA: Diagnosis not present

## 2023-10-08 DIAGNOSIS — R0602 Shortness of breath: Secondary | ICD-10-CM | POA: Diagnosis not present

## 2023-10-08 DIAGNOSIS — R069 Unspecified abnormalities of breathing: Secondary | ICD-10-CM | POA: Diagnosis not present

## 2023-10-08 DIAGNOSIS — I959 Hypotension, unspecified: Secondary | ICD-10-CM | POA: Diagnosis not present

## 2023-10-08 DIAGNOSIS — R918 Other nonspecific abnormal finding of lung field: Secondary | ICD-10-CM | POA: Diagnosis not present

## 2023-10-09 DIAGNOSIS — Z7902 Long term (current) use of antithrombotics/antiplatelets: Secondary | ICD-10-CM | POA: Diagnosis not present

## 2023-10-09 DIAGNOSIS — Z720 Tobacco use: Secondary | ICD-10-CM | POA: Diagnosis not present

## 2023-10-09 DIAGNOSIS — J441 Chronic obstructive pulmonary disease with (acute) exacerbation: Secondary | ICD-10-CM | POA: Diagnosis not present

## 2023-10-09 DIAGNOSIS — I639 Cerebral infarction, unspecified: Secondary | ICD-10-CM | POA: Diagnosis not present

## 2023-10-09 DIAGNOSIS — Z7982 Long term (current) use of aspirin: Secondary | ICD-10-CM | POA: Diagnosis not present

## 2023-10-09 DIAGNOSIS — R911 Solitary pulmonary nodule: Secondary | ICD-10-CM | POA: Diagnosis not present

## 2023-10-09 DIAGNOSIS — J432 Centrilobular emphysema: Secondary | ICD-10-CM | POA: Diagnosis not present

## 2023-10-09 DIAGNOSIS — Z79899 Other long term (current) drug therapy: Secondary | ICD-10-CM | POA: Diagnosis not present

## 2023-10-09 DIAGNOSIS — R0602 Shortness of breath: Secondary | ICD-10-CM | POA: Diagnosis not present

## 2023-10-09 DIAGNOSIS — Z792 Long term (current) use of antibiotics: Secondary | ICD-10-CM | POA: Diagnosis not present

## 2023-10-10 DIAGNOSIS — J439 Emphysema, unspecified: Secondary | ICD-10-CM | POA: Diagnosis not present

## 2023-10-10 DIAGNOSIS — Z792 Long term (current) use of antibiotics: Secondary | ICD-10-CM | POA: Diagnosis not present

## 2023-10-10 DIAGNOSIS — Z79899 Other long term (current) drug therapy: Secondary | ICD-10-CM | POA: Diagnosis not present

## 2023-10-10 DIAGNOSIS — Z720 Tobacco use: Secondary | ICD-10-CM | POA: Diagnosis not present

## 2023-10-10 DIAGNOSIS — Z8673 Personal history of transient ischemic attack (TIA), and cerebral infarction without residual deficits: Secondary | ICD-10-CM | POA: Diagnosis not present

## 2023-10-23 DIAGNOSIS — R911 Solitary pulmonary nodule: Secondary | ICD-10-CM | POA: Diagnosis not present

## 2023-10-24 DIAGNOSIS — J441 Chronic obstructive pulmonary disease with (acute) exacerbation: Secondary | ICD-10-CM | POA: Diagnosis not present

## 2023-11-06 DIAGNOSIS — R059 Cough, unspecified: Secondary | ICD-10-CM | POA: Diagnosis not present

## 2023-11-06 DIAGNOSIS — R0602 Shortness of breath: Secondary | ICD-10-CM | POA: Diagnosis not present

## 2023-11-06 DIAGNOSIS — J441 Chronic obstructive pulmonary disease with (acute) exacerbation: Secondary | ICD-10-CM | POA: Diagnosis not present

## 2023-11-06 DIAGNOSIS — R069 Unspecified abnormalities of breathing: Secondary | ICD-10-CM | POA: Diagnosis not present

## 2023-11-06 DIAGNOSIS — R918 Other nonspecific abnormal finding of lung field: Secondary | ICD-10-CM | POA: Diagnosis not present

## 2023-11-06 DIAGNOSIS — R062 Wheezing: Secondary | ICD-10-CM | POA: Diagnosis not present

## 2023-11-07 DIAGNOSIS — R059 Cough, unspecified: Secondary | ICD-10-CM | POA: Diagnosis not present

## 2023-11-07 DIAGNOSIS — R918 Other nonspecific abnormal finding of lung field: Secondary | ICD-10-CM | POA: Diagnosis not present

## 2023-11-27 ENCOUNTER — Encounter: Payer: Self-pay | Admitting: Emergency Medicine

## 2023-11-27 ENCOUNTER — Ambulatory Visit: Payer: Self-pay | Admitting: Emergency Medicine

## 2024-03-06 DIAGNOSIS — Z20822 Contact with and (suspected) exposure to covid-19: Secondary | ICD-10-CM | POA: Diagnosis not present

## 2024-03-06 DIAGNOSIS — R069 Unspecified abnormalities of breathing: Secondary | ICD-10-CM | POA: Diagnosis not present

## 2024-03-06 DIAGNOSIS — R404 Transient alteration of awareness: Secondary | ICD-10-CM | POA: Diagnosis not present

## 2024-03-06 DIAGNOSIS — J441 Chronic obstructive pulmonary disease with (acute) exacerbation: Secondary | ICD-10-CM | POA: Diagnosis not present

## 2024-03-06 DIAGNOSIS — R0603 Acute respiratory distress: Secondary | ICD-10-CM | POA: Diagnosis not present

## 2024-03-06 DIAGNOSIS — R55 Syncope and collapse: Secondary | ICD-10-CM | POA: Diagnosis not present

## 2024-03-06 DIAGNOSIS — R0681 Apnea, not elsewhere classified: Secondary | ICD-10-CM | POA: Diagnosis not present

## 2024-03-07 DIAGNOSIS — J441 Chronic obstructive pulmonary disease with (acute) exacerbation: Secondary | ICD-10-CM | POA: Diagnosis not present

## 2024-03-07 DIAGNOSIS — R0602 Shortness of breath: Secondary | ICD-10-CM | POA: Diagnosis not present

## 2024-03-07 DIAGNOSIS — R911 Solitary pulmonary nodule: Secondary | ICD-10-CM | POA: Diagnosis not present

## 2024-03-08 DIAGNOSIS — J441 Chronic obstructive pulmonary disease with (acute) exacerbation: Secondary | ICD-10-CM | POA: Diagnosis not present

## 2024-03-09 DIAGNOSIS — J441 Chronic obstructive pulmonary disease with (acute) exacerbation: Secondary | ICD-10-CM | POA: Diagnosis not present

## 2024-03-15 DIAGNOSIS — I499 Cardiac arrhythmia, unspecified: Secondary | ICD-10-CM | POA: Diagnosis not present

## 2024-03-15 DIAGNOSIS — R000 Tachycardia, unspecified: Secondary | ICD-10-CM | POA: Diagnosis not present

## 2024-05-04 ENCOUNTER — Other Ambulatory Visit (HOSPITAL_COMMUNITY): Payer: Self-pay | Admitting: Medical Oncology

## 2024-05-04 DIAGNOSIS — R911 Solitary pulmonary nodule: Secondary | ICD-10-CM

## 2024-08-03 ENCOUNTER — Encounter (HOSPITAL_COMMUNITY): Payer: Self-pay | Admitting: Family Medicine

## 2024-08-03 ENCOUNTER — Observation Stay (HOSPITAL_COMMUNITY): Payer: Self-pay

## 2024-08-03 ENCOUNTER — Other Ambulatory Visit: Payer: Self-pay

## 2024-08-03 ENCOUNTER — Emergency Department (HOSPITAL_COMMUNITY): Payer: Self-pay

## 2024-08-03 ENCOUNTER — Observation Stay (HOSPITAL_COMMUNITY)
Admission: EM | Admit: 2024-08-03 | Discharge: 2024-08-05 | Disposition: A | Discharge status: Home / Self Care | Payer: Self-pay | Attending: Internal Medicine | Admitting: Internal Medicine

## 2024-08-03 DIAGNOSIS — G459 Transient cerebral ischemic attack, unspecified: Secondary | ICD-10-CM | POA: Diagnosis present

## 2024-08-03 DIAGNOSIS — J9601 Acute respiratory failure with hypoxia: Secondary | ICD-10-CM | POA: Insufficient documentation

## 2024-08-03 DIAGNOSIS — Z7982 Long term (current) use of aspirin: Secondary | ICD-10-CM | POA: Insufficient documentation

## 2024-08-03 DIAGNOSIS — Z8673 Personal history of transient ischemic attack (TIA), and cerebral infarction without residual deficits: Secondary | ICD-10-CM | POA: Insufficient documentation

## 2024-08-03 DIAGNOSIS — F17210 Nicotine dependence, cigarettes, uncomplicated: Secondary | ICD-10-CM | POA: Insufficient documentation

## 2024-08-03 DIAGNOSIS — J441 Chronic obstructive pulmonary disease with (acute) exacerbation: Principal | ICD-10-CM | POA: Insufficient documentation

## 2024-08-03 DIAGNOSIS — H5440 Blindness, one eye, unspecified eye: Secondary | ICD-10-CM | POA: Diagnosis present

## 2024-08-03 DIAGNOSIS — Z79899 Other long term (current) drug therapy: Secondary | ICD-10-CM | POA: Insufficient documentation

## 2024-08-03 DIAGNOSIS — R911 Solitary pulmonary nodule: Secondary | ICD-10-CM | POA: Insufficient documentation

## 2024-08-03 DIAGNOSIS — R918 Other nonspecific abnormal finding of lung field: Secondary | ICD-10-CM | POA: Insufficient documentation

## 2024-08-03 LAB — BASIC METABOLIC PANEL WITH GFR
Anion gap: 12 (ref 5–15)
BUN: 12 mg/dL (ref 6–20)
CO2: 25 mmol/L (ref 22–32)
Calcium: 9.1 mg/dL (ref 8.9–10.3)
Chloride: 105 mmol/L (ref 98–111)
Creatinine, Ser: 1.06 mg/dL (ref 0.61–1.24)
GFR, Estimated: >60 mL/min
Glucose, Bld: 93 mg/dL (ref 70–99)
Potassium: 3.7 mmol/L (ref 3.5–5.1)
Sodium: 141 mmol/L (ref 135–145)

## 2024-08-03 LAB — CBC WITH DIFFERENTIAL/PLATELET
Abs Immature Granulocytes: 0.01 10*3/uL (ref 0.00–0.07)
Basophils Absolute: 0 10*3/uL (ref 0.0–0.1)
Basophils Relative: 1 %
Eosinophils Absolute: 0.4 10*3/uL (ref 0.0–0.5)
Eosinophils Relative: 11 %
HCT: 46 % (ref 39.0–52.0)
Hemoglobin: 15.3 g/dL (ref 13.0–17.0)
Immature Granulocytes: 0 %
Lymphocytes Relative: 43 %
Lymphs Abs: 1.8 10*3/uL (ref 0.7–4.0)
MCH: 29.3 pg (ref 26.0–34.0)
MCHC: 33.3 g/dL (ref 30.0–36.0)
MCV: 88 fL (ref 80.0–100.0)
Monocytes Absolute: 0.5 10*3/uL (ref 0.1–1.0)
Monocytes Relative: 13 %
Neutro Abs: 1.3 10*3/uL — ABNORMAL LOW (ref 1.7–7.7)
Neutrophils Relative %: 32 %
Platelets: 223 10*3/uL (ref 150–400)
RBC: 5.23 MIL/uL (ref 4.22–5.81)
RDW: 13.4 % (ref 11.5–15.5)
WBC: 4 10*3/uL (ref 4.0–10.5)
nRBC: 0 % (ref 0.0–0.2)

## 2024-08-03 LAB — RESP PANEL BY RT-PCR (RSV, FLU A&B, COVID)  RVPGX2
Influenza A by PCR: NEGATIVE
Influenza B by PCR: NEGATIVE
Resp Syncytial Virus by PCR: NEGATIVE
SARS Coronavirus 2 by RT PCR: NEGATIVE

## 2024-08-03 LAB — PRO BRAIN NATRIURETIC PEPTIDE: Pro Brain Natriuretic Peptide: <50 pg/mL

## 2024-08-03 LAB — TROPONIN T, HIGH SENSITIVITY: Troponin T High Sensitivity: 11 ng/L (ref 0–19)

## 2024-08-03 MED ORDER — SODIUM CHLORIDE 0.9% FLUSH: 3.0000 mL | INTRAVENOUS | Status: DC | PRN

## 2024-08-03 MED ORDER — METHYLPREDNISOLONE SODIUM SUCC 40 MG IJ SOLR
40.0000 mg | Freq: Two times a day (BID) | INTRAMUSCULAR | Status: DC
  Administered 2024-08-04 – 2024-08-05 (×3): 40 mg via INTRAVENOUS
  Filled 2024-08-03 (×3): qty 1

## 2024-08-03 MED ORDER — SODIUM CHLORIDE 0.9 % IV SOLN: INTRAVENOUS | Status: AC

## 2024-08-03 MED ORDER — BENZONATATE 100 MG PO CAPS
100.0000 mg | ORAL_CAPSULE | Freq: Once | ORAL | Status: AC
  Administered 2024-08-03: 100 mg via ORAL
  Filled 2024-08-03: qty 1

## 2024-08-03 MED ORDER — IOHEXOL 300 MG/ML  SOLN
80.0000 mL | Freq: Once | INTRAMUSCULAR | Status: AC | PRN
  Administered 2024-08-03: 80 mL via INTRAVENOUS

## 2024-08-03 MED ORDER — IPRATROPIUM-ALBUTEROL 0.5-2.5 (3) MG/3ML IN SOLN
3.0000 mL | Freq: Four times a day (QID) | RESPIRATORY_TRACT | Status: DC
  Administered 2024-08-03 – 2024-08-04 (×3): 3 mL via RESPIRATORY_TRACT
  Filled 2024-08-03 (×3): qty 3

## 2024-08-03 MED ORDER — SODIUM CHLORIDE 0.9% FLUSH
3.0000 mL | Freq: Two times a day (BID) | INTRAVENOUS | Status: DC
  Administered 2024-08-04 – 2024-08-05 (×3): 3 mL via INTRAVENOUS

## 2024-08-03 MED ORDER — ONDANSETRON HCL 4 MG/2ML IJ SOLN: 4.0000 mg | Freq: Four times a day (QID) | INTRAMUSCULAR | Status: DC | PRN

## 2024-08-03 MED ORDER — SODIUM CHLORIDE 0.9% FLUSH
3.0000 mL | Freq: Two times a day (BID) | INTRAVENOUS | Status: DC
  Administered 2024-08-03 – 2024-08-04 (×3): 3 mL via INTRAVENOUS

## 2024-08-03 MED ORDER — POLYETHYLENE GLYCOL 3350 17 G PO PACK: 17.0000 g | PACK | Freq: Every day | ORAL | Status: DC | PRN

## 2024-08-03 MED ORDER — ACETAMINOPHEN 325 MG PO TABS
650.0000 mg | ORAL_TABLET | Freq: Four times a day (QID) | ORAL | Status: DC | PRN
  Administered 2024-08-04 – 2024-08-05 (×4): 650 mg via ORAL
  Filled 2024-08-03 (×4): qty 2

## 2024-08-03 MED ORDER — ONDANSETRON HCL 4 MG PO TABS: 4.0000 mg | ORAL_TABLET | Freq: Four times a day (QID) | ORAL | Status: DC | PRN

## 2024-08-03 MED ORDER — BISACODYL 10 MG RE SUPP: 10.0000 mg | Freq: Every day | RECTAL | Status: DC | PRN

## 2024-08-03 MED ORDER — AZITHROMYCIN 250 MG PO TABS
500.0000 mg | ORAL_TABLET | Freq: Every day | ORAL | Status: DC
  Administered 2024-08-03 – 2024-08-05 (×3): 500 mg via ORAL
  Filled 2024-08-03 (×3): qty 2

## 2024-08-03 MED ORDER — ALBUTEROL SULFATE (2.5 MG/3ML) 0.083% IN NEBU
2.5000 mg | INHALATION_SOLUTION | RESPIRATORY_TRACT | Status: DC | PRN
  Administered 2024-08-04: 2.5 mg via RESPIRATORY_TRACT
  Filled 2024-08-03: qty 3

## 2024-08-03 MED ORDER — CLOPIDOGREL BISULFATE 75 MG PO TABS
75.0000 mg | ORAL_TABLET | Freq: Every day | ORAL | Status: DC
  Administered 2024-08-03 – 2024-08-05 (×3): 75 mg via ORAL
  Filled 2024-08-03 (×3): qty 1

## 2024-08-03 MED ORDER — ALBUTEROL (5 MG/ML) CONTINUOUS INHALATION SOLN
10.0000 mg/h | INHALATION_SOLUTION | RESPIRATORY_TRACT | Status: DC
  Administered 2024-08-03: 10 mg/h via RESPIRATORY_TRACT
  Filled 2024-08-03: qty 20

## 2024-08-03 MED ORDER — SODIUM CHLORIDE 0.9 % IV SOLN: INTRAVENOUS | Status: AC | PRN

## 2024-08-03 MED ORDER — MAGNESIUM SULFATE 2 GM/50ML IV SOLN
2.0000 g | Freq: Once | INTRAVENOUS | Status: AC
  Administered 2024-08-03: 2 g via INTRAVENOUS
  Filled 2024-08-03: qty 50

## 2024-08-03 MED ORDER — HEPARIN SODIUM (PORCINE) 5000 UNIT/ML IJ SOLN
5000.0000 [IU] | Freq: Three times a day (TID) | INTRAMUSCULAR | Status: DC
  Administered 2024-08-03 – 2024-08-05 (×5): 5000 [IU] via SUBCUTANEOUS
  Filled 2024-08-03 (×5): qty 1

## 2024-08-03 MED ORDER — NICOTINE 14 MG/24HR TD PT24: 14.0000 mg | MEDICATED_PATCH | Freq: Every day | TRANSDERMAL | Status: DC

## 2024-08-03 MED ORDER — IPRATROPIUM-ALBUTEROL 0.5-2.5 (3) MG/3ML IN SOLN
RESPIRATORY_TRACT | Status: AC
  Filled 2024-08-03: qty 3

## 2024-08-03 MED ORDER — ACETAMINOPHEN 650 MG RE SUPP: 650.0000 mg | Freq: Four times a day (QID) | RECTAL | Status: DC | PRN

## 2024-08-03 MED ORDER — METHYLPREDNISOLONE SODIUM SUCC 125 MG IJ SOLR
125.0000 mg | Freq: Once | INTRAMUSCULAR | Status: AC
  Administered 2024-08-03: 125 mg via INTRAVENOUS
  Filled 2024-08-03: qty 2

## 2024-08-03 MED ORDER — ASPIRIN 81 MG PO CHEW
81.0000 mg | CHEWABLE_TABLET | Freq: Every day | ORAL | Status: DC
  Administered 2024-08-04 – 2024-08-05 (×2): 81 mg via ORAL
  Filled 2024-08-03 (×2): qty 1

## 2024-08-03 MED ORDER — ATORVASTATIN CALCIUM 40 MG PO TABS
40.0000 mg | ORAL_TABLET | Freq: Every day | ORAL | Status: DC
  Administered 2024-08-03 – 2024-08-05 (×3): 40 mg via ORAL
  Filled 2024-08-03 (×3): qty 1

## 2024-08-03 MED ORDER — BUDESON-GLYCOPYRROL-FORMOTEROL 160-9-4.8 MCG/ACT IN AERO
2.0000 | INHALATION_SPRAY | Freq: Two times a day (BID) | RESPIRATORY_TRACT | Status: DC
  Administered 2024-08-03 – 2024-08-05 (×4): 2 via RESPIRATORY_TRACT
  Filled 2024-08-03 (×2): qty 5.9

## 2024-08-03 MED ORDER — ALBUTEROL SULFATE (2.5 MG/3ML) 0.083% IN NEBU
INHALATION_SOLUTION | RESPIRATORY_TRACT | Status: AC
  Filled 2024-08-03: qty 12

## 2024-08-03 MED ORDER — DM-GUAIFENESIN ER 30-600 MG PO TB12
1.0000 | ORAL_TABLET | Freq: Two times a day (BID) | ORAL | Status: DC
  Administered 2024-08-03 – 2024-08-04 (×2): 1 via ORAL
  Filled 2024-08-03 (×2): qty 1

## 2024-08-03 NOTE — H&P (Signed)
 " History and Physical    Patient: Julian Rogers FMW:[id number] DOB: May 26, 1966 DOA: 08/03/2024 DOS: the patient was seen and examined on 08/03/2024 PCP: Patient, No Pcp Per  Patient coming from: Home  Chief Complaint:  Chief Complaint  Patient presents with   Shortness of Breath   HPI: Julian Rogers is a 59 y.o. male who is a reformed smoker with medical history significant for COPD, right eye vision loss as a child, history of TIAs who presents to the ED with a 2-week history of URI symptoms that have failed to resolve - Per patient wheezing cough and dyspnea has worsened over the last few days No fever  Or chills  -- Despite bronchodilator treatments in the ED cough, wheezing and dyspnea as well as hypoxia persisted  No Nausea, Vomiting or Diarrhea No chest pains, no leg swelling no pleuritic symptoms - ED chest x-ray without acute cardiopulmonary findings -Flu COVID and RSV negative - BMP WNL with normal renal function and electrolytes - proBNP less than 50, troponin 11  Review of Systems: As mentioned in the history of present illness. All other systems reviewed and are negative. 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 He has a 12.5 pack-year smoking history. He has never used smokeless tobacco. He reports current alcohol use. He reports that he does not use drugs.  Allergies[1]  Family History  Problem Relation Age of Onset   Cancer Mother     Prior to Admission medications  Medication Sig Start Date End Date Taking? Authorizing Provider  albuterol  (VENTOLIN  HFA) 108 (90 Base) MCG/ACT inhaler Inhale 2 puffs into the lungs every 6 (six) hours as needed for wheezing or shortness of breath. 01/14/22    Jinwala, Sagar H, MD  albuterol  (VENTOLIN  HFA) 108 (90 Base) MCG/ACT inhaler Inhale 2 puffs into the lungs every 4 (four) hours as needed for wheezing or shortness of breath. 01/14/22   Jinwala, Sagar H, MD  aspirin  81 MG chewable tablet Chew 1 tablet (81 mg total) by mouth daily. 01/15/22   Jinwala, Sagar H, MD  atorvastatin  (LIPITOR) 40 MG tablet Take 1 tablet (40 mg total) by mouth daily. 01/15/22   Jinwala, Sagar H, MD  clopidogrel  (PLAVIX ) 75 MG tablet Take 1 tablet (75 mg total) by mouth daily. 01/15/22   Jinwala, Sagar H, MD  nicotine  (NICODERM CQ  - DOSED IN MG/24 HOURS) 14 mg/24hr patch Place 1 patch (14 mg total) onto the skin daily. 01/15/22   Jinwala, Sagar H, MD  Tiotropium Bromide  Monohydrate (SPIRIVA  RESPIMAT) 2.5 MCG/ACT AERS Inhale 2 puffs into the lungs daily. 01/14/22   Emmy Justus DEL, MD    Physical Exam: Vitals:   08/03/24 1445 08/03/24 1609 08/03/24 1630 08/03/24 1631  BP:   (!) 150/89   Pulse: (!) 121  (!) 116   Resp: 19  (!) 22   Temp:    98.3 F (36.8 C)  TempSrc:    Oral  SpO2: 97% 97% 100%   Weight:      Height:        Physical Exam  Gen:- Awake Alert, conversational dyspnea improving with bronchodilators HEENT:- [place].AT, No sclera icterus Nose--[place]  2L/min Neck-Supple Neck,No JVD,.  Lungs-diminished breath sounds, scattered wheezes bilaterally CV- S1, S2 normal, RRR Abd-  +ve B.Sounds, Abd Soft, No tenderness,    Extremity/Skin:- No  edema,   good pedal pulses  Psych-affect is appropriate, oriented x3 Neuro-no new focal deficits, no tremors  Data Reviewed: ED chest x-ray without acute cardiopulmonary findings -Flu COVID and RSV negative - BMP WNL with normal renal function and electrolytes - proBNP less than 50, troponin 11  Assessment and Plan: 1) acute COPD exacerbation/emphysema --- reformed smoker -Chest x-ray without pneumonia -Flu, RSV and COVID-negative - IV Solu-Medrol , azithromycin  bronchodilators and mucolytics as ordered  2) acute hypoxic  respiratory failure--due to #1 above -- Patient requiring about 2 L of oxygen via nasal cannula   3)history of TIA/stroke--- 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 Observation status is judged to be reasonable and necessary in order to provide the required intensity of service to ensure the patient's safety. The patient's presenting symptoms, physical exam findings, and initial radiographic and laboratory data in the context of their medical condition is felt to place them at decreased risk for further clinical deterioration. Furthermore, it is anticipated that the patient will be medically stable for discharge from the hospital within 2 midnights of admission.   Author: Rendall Carwin, MD 08/03/2024 6:30 PM  For on call review [email or web address].     [1]  Allergies Allergen Reactions   Penicillins Itching    Has patient had a PCN reaction causing immediate rash, facial/tongue/throat swelling, SOB or lightheadedness with hypotension: No Has patient had a PCN reaction causing severe rash involving mucus membranes or skin necrosis: No Has patient had a PCN reaction that required hospitalization No Has patient had a PCN reaction occurring within the last 10 years: No If all of the above answers are NO, then may proceed with Cephalosporin use.    Doxycycline  Rash   "

## 2024-08-03 NOTE — ED Triage Notes (Signed)
 Pt arrived via [facility] Pt states that pt has been having shortness of breath the past three days. It's only progressed and today it's gotten worse. He has continuously been giving himself albuterol  treatments at home. Pt states he did one treatment around 1010 this morning. EMS provided pt with two albuterol  treatments and one duoneb. Solumedrol also was provided by EMS. While utilizing a breathing treatment pt was on 10 lpm and maintaining 100%. Pt states that he has had a cough chronically and the past two weeks has been getting worse and worse. Pt states he has been coughing up brown and clear mucus. Upon assessment pt has diminished lung sounds and expiratory and inspiratory wheezing bilat.

## 2024-08-03 NOTE — ED Provider Notes (Signed)
 " [place] EMERGENCY DEPARTMENT AT [facility] Provider Note   CSN: [id number] Arrival date & time: 08/03/24  1024     Patient presents with: Shortness of Breath   Julian Rogers is a 59 y.o. male with a history significant for COPD, former tobacco abuser, stating he quit when he was diagnosed with his lung disease, also history of TIA presenting for COPD exacerbation.  He reports slowly worsening wheezing along with shortness of breath with frequent cough with occasional productive cough of clear to yellow-tinged sputum over the past 2 weeks.  He has not recently moved back from [place]  to this area, does not have a current PCP and has run out of several medications, stating his albuterol  MDI is almost out and he used his last albuterol  neb treatment prior to arrival.  At 1 point in time he was also prescribed Symbicort  and was given this in the hospital but has been unable to afford refills of this medication.  He was last admitted for COPD in [place]  before Christmas.  Has been doing well until the last 2 weeks.  Denies fevers, he does endorse chest pain but is triggered by coughing episodes, gone when he is not coughing.  Denies fevers.  He was given 2 albuterol  treatments prior to arrival along with Solu-Medrol  125 mg IV and is currently receiving a DuoNeb treatment. Has never required intubation for COPD flare.   The history is provided by the patient.       Prior to Admission medications  Medication Sig Start Date End Date Taking? Authorizing Provider  albuterol  (VENTOLIN  HFA) 108 (90 Base) MCG/ACT inhaler Inhale 2 puffs into the lungs every 6 (six) hours as needed for wheezing or shortness of breath. 01/14/22   Jinwala, Sagar H, MD  albuterol  (VENTOLIN  HFA) 108 (90 Base) MCG/ACT inhaler Inhale 2 puffs into the lungs every 4 (four) hours as needed for wheezing or shortness of breath. 01/14/22   Jinwala, Sagar H, MD  aspirin  81 MG chewable tablet Chew 1 tablet (81 mg total) by  mouth daily. 01/15/22   Jinwala, Sagar H, MD  atorvastatin  (LIPITOR) 40 MG tablet Take 1 tablet (40 mg total) by mouth daily. 01/15/22   Jinwala, Sagar H, MD  clopidogrel  (PLAVIX ) 75 MG tablet Take 1 tablet (75 mg total) by mouth daily. 01/15/22   Jinwala, Sagar H, MD  nicotine  (NICODERM CQ  - DOSED IN MG/24 HOURS) 14 mg/24hr patch Place 1 patch (14 mg total) onto the skin daily. 01/15/22   Jinwala, Sagar H, MD  Tiotropium Bromide  Monohydrate (SPIRIVA  RESPIMAT) 2.5 MCG/ACT AERS Inhale 2 puffs into the lungs daily. 01/14/22   Jinwala, Sagar H, MD    Allergies: Penicillins and Doxycycline     Review of Systems  Constitutional:  Negative for fever.  HENT:  Negative for congestion and sore throat.   Eyes: Negative.   Respiratory:  Positive for cough, shortness of breath and wheezing. Negative for chest tightness.   Cardiovascular:  Positive for chest pain. Negative for palpitations and leg swelling.  Gastrointestinal:  Negative for abdominal pain, nausea and vomiting.  Genitourinary: Negative.   Musculoskeletal:  Negative for arthralgias, joint swelling and neck pain.  Skin: Negative.  Negative for rash and wound.  Neurological:  Negative for dizziness, weakness, light-headedness, numbness and headaches.  Psychiatric/Behavioral: Negative.      Updated Vital Signs BP (!) 140/91   Pulse 81   Temp 98.2 F (36.8 C) (Oral)   Resp 18   Ht 6' 5 (  1.956 m)   Wt 97.5 kg   SpO2 99%   BMI 25.50 kg/m   Physical Exam Vitals and nursing note reviewed.  Constitutional:      Appearance: He is well-developed.  HENT:     Head: Normocephalic and atraumatic.  Eyes:     Conjunctiva/sclera: Conjunctivae normal.  Cardiovascular:     Rate and Rhythm: Normal rate and regular rhythm.     Heart sounds: Normal heart sounds.  Pulmonary:     Effort: Pulmonary effort is normal.     Breath sounds: Decreased breath sounds and wheezing present.     Comments: Decreased breath sounds and expiratory wheeze  throughout all lung fields, prolonged expirations.  No rales. Abdominal:     General: Bowel sounds are normal.     Palpations: Abdomen is soft.     Tenderness: There is no abdominal tenderness.  Musculoskeletal:        General: Normal range of motion.     Cervical back: Normal range of motion.     Right lower leg: No edema.     Left lower leg: No edema.  Skin:    General: Skin is warm and dry.  Neurological:     Mental Status: He is alert.     (all labs ordered are listed, but only abnormal results are displayed) Labs Reviewed  CBC WITH DIFFERENTIAL/PLATELET - Abnormal; Notable for the following components:      Result Value   Neutro Abs 1.3 (*)    All other components within normal limits  RESP PANEL BY RT-PCR (RSV, FLU A&B, COVID)  RVPGX2  BASIC METABOLIC PANEL WITH GFR  PRO BRAIN NATRIURETIC PEPTIDE  TROPONIN T, HIGH SENSITIVITY    EKG: EKG Interpretation Date/Time:  Tuesday August 03 2024 13:08:40 EST Ventricular Rate:  87 PR Interval:  211 QRS Duration:  85 QT Interval:  354 QTC Calculation: 429 R Axis:   84  Text Interpretation: Sinus rhythm Prolonged PR interval  no acute ST/T changes compared to 2023 Confirmed by Freddi Hamilton ([id number]) on 08/03/2024 1:12:39 PM  Radiology: [facility] Chest Port 1 View Result Date: 08/03/2024 CLINICAL DATA:  sob, wheezing EXAM: PORTABLE CHEST - 1 VIEW COMPARISON:  01/19/2022 FINDINGS: Cardiomediastinal silhouette and pulmonary vasculature are within normal limits. Known RIGHT upper lobe pulmonary nodule is better seen on prior CT from 01/13/2022. Lungs are otherwise clear. IMPRESSION: 1. No acute cardiopulmonary process. 2. Nonemergent contrast enhanced chest CT should be performed for follow-up of previously seen RIGHT upper lobe pulmonary nodule best visualized on CT angiography of the chest from 01/13/2022. Electronically Signed   By: Aliene Lloyd M.D.   On: 08/03/2024 11:21     Procedures   Medications Ordered in the ED  albuterol   (PROVENTIL ) (2.5 MG/3ML) 0.083% nebulizer solution (  Not Given 08/03/24 1244)  albuterol  (PROVENTIL ,VENTOLIN ) solution continuous neb (0 mg/hr Nebulization Stopped 08/03/24 1411)  magnesium  sulfate IVPB 2 g 50 mL (2 g Intravenous New Bag/Given 08/03/24 1337)    Clinical Course as of 08/03/24 1419  Tue Aug 03, 2024  1248 Re exam after duoneb,  still no improvement in aeration and wheezing.  Albuterol  10 mg hour long ordered. [JI]  1417 At recheck,  after hourlong albuterol  10 mg neb,  pt still active wheezing,  drops to 89% when on room air.  [JI]    Clinical Course User Index [JI] Julianny Milstein, PA-C  Medical Decision Making Patient with a history of COPD presenting with suspected acute exacerbation, additional differential diagnosis would include pneumonia, bronchitis, CHF, viral lower respiratory infection/RSV/COVID.  He was given multiple breathing treatments along with IV Solu-Medrol .  He continues to have wheezing and shortness of breath and increased work of breathing, he will require admission.  Amount and/or Complexity of Data Reviewed Labs: ordered.    Details: Labs reviewed, negative troponin at 11, proBNP normal at less than 50, be met and CBC also unremarkable, respiratory panel is negative for COVID, influenza and RSV. Radiology: ordered.    Details: Chest x-ray reviewed, no acute cardiopulmonary process.  He has a right upper lobe nodule noted on previous imaging, recommended nonemergent contrasted CT chest.  Discussion of management or test interpretation with external provider(s): Call placed to hospitalist for admission.  Spoke with Dr. Pearlean who accepts pt for admission.  Risk Decision regarding hospitalization.        Final diagnoses:  COPD exacerbation [facility])    ED Discharge Orders     None          Birdena Clarity, PA-C 08/03/24 1440  "

## 2024-08-04 DIAGNOSIS — G459 Transient cerebral ischemic attack, unspecified: Secondary | ICD-10-CM

## 2024-08-04 DIAGNOSIS — J9601 Acute respiratory failure with hypoxia: Secondary | ICD-10-CM

## 2024-08-04 DIAGNOSIS — R918 Other nonspecific abnormal finding of lung field: Secondary | ICD-10-CM

## 2024-08-04 LAB — MISC LABCORP TEST (SEND OUT): Labcorp test code: 83935

## 2024-08-04 MED ORDER — IBUPROFEN 400 MG PO TABS
400.0000 mg | ORAL_TABLET | Freq: Once | ORAL | Status: AC
  Administered 2024-08-04: 400 mg via ORAL
  Filled 2024-08-04: qty 1

## 2024-08-04 MED ORDER — GUAIFENESIN ER 600 MG PO TB12
1200.0000 mg | ORAL_TABLET | Freq: Two times a day (BID) | ORAL | Status: DC
  Administered 2024-08-04 – 2024-08-05 (×3): 1200 mg via ORAL
  Filled 2024-08-04 (×3): qty 2

## 2024-08-04 MED ORDER — IPRATROPIUM-ALBUTEROL 0.5-2.5 (3) MG/3ML IN SOLN
3.0000 mL | Freq: Three times a day (TID) | RESPIRATORY_TRACT | Status: DC
  Administered 2024-08-04 – 2024-08-05 (×2): 3 mL via RESPIRATORY_TRACT
  Filled 2024-08-04 (×2): qty 3

## 2024-08-04 MED ORDER — GUAIFENESIN 100 MG/5ML PO LIQD
5.0000 mL | ORAL | Status: DC | PRN
  Administered 2024-08-04 (×2): 5 mL via ORAL
  Filled 2024-08-04 (×2): qty 5

## 2024-08-04 NOTE — TOC Progression Note (Signed)
 Transition of Care [facility]) - Progression Note    Patient Details  Name: Julian Rogers MRN: [id number] Date of Birth: 1965-07-27  Transition of Care [facility]) CM/SW Contact  [name], [place] Phone Number: 08/04/2024, 2:19 PM  Clinical Narrative: LCSW discussed PT recommendation for outpatient PT with pt. He requests to hold off as he does not have insurance. Pt plans to apply for Medicaid. Agreeable to sending message to financial counselor to screen for Medicaid. Referred to [facility].         Barriers to Discharge: Continued Medical Work up               Expected Discharge Plan and Services                                               Social Drivers of Health (SDOH) Interventions SDOH Screenings   Food Insecurity: No Food Insecurity (08/03/2024)  Housing: High Risk (08/03/2024)  Transportation Needs: No Transportation Needs (08/03/2024)  Utilities: Not At Risk (08/03/2024)  Financial Resource Strain: Medium Risk (10/09/2023)   Received from [facility]  Physical Activity: Sufficiently Active (10/23/2023)   Received from [facility]  Social Connections: Unknown (10/23/2023)   Received from [facility]  Stress: No Stress Concern Present (10/23/2023)   Received from [facility]  Tobacco Use: High Risk (08/03/2024)  Health Literacy: Low Risk (10/23/2023)   Received from [facility]    Readmission Risk Interventions     No data to display

## 2024-08-04 NOTE — Plan of Care (Signed)

## 2024-08-04 NOTE — Progress Notes (Signed)
 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  Patient is admitted to hospitalist service for acute COPD exacerbation.  Assessment and Plan: Acute COPD exacerbation- Patient will be continued on IV Solu-Medrol . Continue azithromycin  therapy. Continue bronchodilators, mucolytics scheduled and as needed. PT OT evaluation.  Encourage incentive spirometry.  Acute hypoxic respiratory failure: Due to COPD.  Continue to wean supplemental oxygen. Encourage out of bed to chair, ambulation.  History of TIA/stroke- Continue aspirin , statin therapy.  Pulmonary nodule- CT chest done showed diffuse emphysematous changes. Right upper lobe solid pulmonary nodule 10 mm in diameter stable. Left upper lobe medial lung nodule measuring 0.5 x 1.8 cm in diameter is stable. Patient is new to the area, TOC to arrange for PCP.     Out of bed to chair. Incentive spirometry. Nursing supportive care. Fall, aspiration precautions. Diet:  Diet Orders (From admission, onward)     Start     Ordered   08/03/24 1503  Diet Heart Room service appropriate? Yes; Fluid consistency: Thin  Diet effective now       Question Answer Comment  Room service appropriate? Yes   Fluid consistency: Thin      08/03/24 1504           DVT prophylaxis: heparin  injection 5,000 Units Start: 08/03/24 1800 SCDs Start: 08/03/24 1503 Place TED hose Start: 08/03/24 1503  Level of care: Med-Surg   Code Status: Full Code  Subjective: Patient is seen and examined today morning.  He is lying in bed.  Does have audible wheezes.  Able to get out of bed.   Reported having severe cough with phlegm.  Eating fair.  Physical Exam: Vitals:   08/04/24 0345 08/04/24 0736 08/04/24 1128 08/04/24 1231  BP:    139/73  Pulse:    (!) 101  Resp:      Temp:    98.3 F (36.8 C)  TempSrc:    Oral  SpO2: 93% 96% 96% 96%  Weight:      Height:        General - Middle aged African-American emale, no apparent distress HEENT - PERRLA, EOMI, atraumatic head, non tender sinuses. Lung - Clear, no rales, diffuse wheezes. Heart - S1, S2 heard, no murmurs, rubs, no pedal edema. Abdomen - Soft, non tender, bowel sounds good Neuro - Alert, awake and oriented x 3, non focal exam. Skin - Warm and dry.  Data Reviewed:      Latest Ref Rng & Units 08/03/2024   10:58 AM 01/19/2022    6:25 PM 01/13/2022    1:49 AM  CBC  WBC 4.0 - 10.5 K/uL 4.0  10.0  6.4   Hemoglobin 13.0 - 17.0 g/dL 84.6  85.1  83.4   Hematocrit 39.0 - 52.0 % 46.0  45.2  47.3   Platelets 150 - 400 K/uL 223  248  337       Latest Ref Rng & Units 08/03/2024   10:58 AM 01/19/2022    6:25 PM 01/14/2022    4:45 AM  BMP  Glucose 70 - 99 mg/dL 93  887  890   BUN 6 -  20 mg/dL 12  8  14    Creatinine 0.61 - 1.24 mg/dL 8.93  8.88  8.79   Sodium 135 - 145 mmol/L 141  138  139   Potassium 3.5 - 5.1 mmol/L 3.7  3.7  4.0   Chloride 98 - 111 mmol/L 105  102  105   CO2 22 - 32 mmol/L 25  23  27    Calcium  8.9 - 10.3 mg/dL 9.1  9.2  9.2    CT CHEST W CONTRAST Result Date: 08/03/2024 EXAM: CT CHEST WITH CONTRAST 08/03/2024 07:10:56 PM TECHNIQUE: CT of the chest was performed with the administration of 80 mL of iohexol  (OMNIPAQUE ) 300 MG/ML solution. Multiplanar reformatted images are provided for review. Automated exposure control, iterative reconstruction, and/or weight based adjustment of the mA/kV was utilized to reduce the radiation dose to as low as reasonably achievable. COMPARISON: Chest radiograph 08/03/2024 and CT chest 01/13/2022. CLINICAL HISTORY: Abnormal X-ray showing lung nodule, greater than or  equal to 1 cm. Shortness of breath for 3 days. FINDINGS: MEDIASTINUM: Heart and pericardium are unremarkable. Normal heart size. No pericardial effusions. Normal caliber thoracic aorta. The central airways are clear. The thyroid gland is unremarkable. The esophagus is decompressed. LYMPH NODES: No mediastinal, hilar or axillary lymphadenopathy. LUNGS AND PLEURA: Circumscribed right upper lobe solid pulmonary nodule measuring 10 mm in diameter. Series 4 image 30. No change in size or appearance since the previous study. Left upper lobe medial lung nodule measuring 0.5 x 1.8 cm diameter is also unchanged since prior study. No airspace disease or consolidation in the lungs. Scattered diffuse emphysematous changes throughout the lungs. Scarring in the lung bases. No pleural effusion or pneumothorax. Bronchial wall thickening consistent with chronic bronchitis. SOFT TISSUES/BONES: No acute abnormality of the bones or soft tissues. No acute bony abnormalities or focal bone lesions. UPPER ABDOMEN: Limited images of the upper abdomen demonstrates no acute abnormality. No acute abnormalities demonstrated in the visualized upper abdomen. IMPRESSION: 1. No acute intrathoracic abnormality to explain shortness of breath. 2. Scattered diffuse emphysematous changes throughout the lungs, which is an independent risk factor for lung cancer and warrants consideration for evaluation for a low-dose CT lung cancer screening program. 3. Bronchial wall thickening consistent with chronic bronchitis. 4. Stable circumscribed right upper lobe solid pulmonary nodule measuring 10 mm in diameter. 5. Stable left upper lobe medial lung nodule measuring 0.5 by 1.8 centimeters in diameter. Electronically signed by: Elsie Gravely MD 08/03/2024 07:37 PM EST RP Workstation: [id number]   DG Chest Port 1 View Result Date: 08/03/2024 CLINICAL DATA:  sob, wheezing EXAM: PORTABLE CHEST - 1 VIEW COMPARISON:  01/19/2022 FINDINGS: Cardiomediastinal  silhouette and pulmonary vasculature are within normal limits. Known RIGHT upper lobe pulmonary nodule is better seen on prior CT from 01/13/2022. Lungs are otherwise clear. IMPRESSION: 1. No acute cardiopulmonary process. 2. Nonemergent contrast enhanced chest CT should be performed for follow-up of previously seen RIGHT upper lobe pulmonary nodule best visualized on CT angiography of the chest from 01/13/2022. Electronically Signed   By: Aliene Lloyd M.D.   On: 08/03/2024 11:21    Family Communication: Discussed with patient, understand and agree. All questions answered.  Disposition: Status is: Observation The patient remains OBS appropriate and will d/c before 2 midnights.  Planned Discharge Destination: Home     Time spent: 45 minutes  Author: Concepcion Riser, MD 08/04/2024 4:20 PM Secure chat 7am to 7pm For on call review [email or web address].    "

## 2024-08-04 NOTE — Plan of Care (Addendum)
" °  Problem: Acute Rehab PT Goals(only PT should resolve) Goal: Pt Will Ambulate Outcome: Progressing Flowsheets (Taken 08/04/2024 1412) Pt will Ambulate:  100 feet  Independently   Lang Ada, PT, DPT [facility] Office: [phone] 2:13 PM, 08/04/24  "

## 2024-08-04 NOTE — Evaluation (Signed)
 Physical Therapy Evaluation Patient Details Name: Julian Rogers MRN: [id number] DOB: 1965/11/02 Today's Date: 08/04/2024  History of Present Illness  Julian Rogers is a 59 y.o. 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 Patient also demonstrates WFL gait during today's session although decreased speed is noted, and coughing spell started after pt returns from walk. Patients symptoms likely arising from COPD. Patient also demonstrates modified independence for bed mobility, functional transfers and ambulation. Patient requires education on incentive spirometry, acapella PEP device (appreciate respiratory's assistance), PT recommendations, POC, and importance of consistent mobility. Patient would benefit from skilled acute physical therapy for increased independence with ambulation, increased LE strength, and activity tolerance for improved functional mobility, return to higher level of function with ADLs, and progress towards therapy goals.         If plan is discharge home, recommend the following: Other (comment) (could return home as coughing symptoms subside and breathing/activity tolerance improve)   Can travel by private vehicle        Equipment Recommendations Other (comment) (acapella device (given at eval))  Recommendations for Other Services       Functional Status Assessment Patient has had a recent decline in  their functional status and demonstrates the ability to make significant improvements in function in a reasonable and predictable amount of time.     Precautions / Restrictions Precautions Precautions: Other (comment) (Oxygen) Recall of Precautions/Restrictions: Intact Restrictions Weight Bearing Restrictions Per Provider Order: No      Mobility  Bed Mobility Overal bed mobility: Modified Independent                  Transfers Overall transfer level: Modified independent                      Ambulation/Gait Ambulation/Gait assistance: Modified independent (Device/Increase time) Gait Distance (Feet): 75 Feet Assistive device: None Gait Pattern/deviations: WFL(Within Functional Limits), Decreased stride length Gait velocity: decreased     General Gait Details: decreased speed, no imbalance noted  Stairs            Wheelchair Mobility     Tilt Bed    Modified Rankin (Stroke Patients Only)       Balance Overall balance assessment: Independent                                           Pertinent Vitals/Pain Pain Assessment Pain Assessment: Faces Faces Pain Scale: Hurts a little bit Pain Location: headache Pain Descriptors / Indicators: Aching Pain Intervention(s): Limited activity within patient's tolerance, Repositioned    Home Living Family/patient expects to be discharged to:: Private residence Living Arrangements: Spouse/significant other Available Help at Discharge: Family;Available 24 hours/day Type of Home: Apartment Home Access: Level entry  Home Layout: One level Home Equipment: None      Prior Function Prior Level of Function : Independent/Modified Independent;Driving                     Extremity/Trunk Assessment   Upper Extremity Assessment Upper Extremity Assessment: Overall WFL for tasks assessed    Lower Extremity Assessment Lower Extremity Assessment: Generalized  weakness;Overall WFL for tasks assessed    Cervical / Trunk Assessment Cervical / Trunk Assessment: Normal  Communication   Communication Communication: No apparent difficulties    Cognition Arousal: Alert Behavior During Therapy: WFL for tasks assessed/performed   PT - Cognitive impairments: No apparent impairments                         Following commands: Intact       Cueing Cueing Techniques: Verbal cues     General Comments      Exercises Other Exercises Other Exercises: Incentive spirometry (5 reps) Other Exercises: Acapella vibratory PEP therapy (1 set of 4 breaths)   Assessment/Plan    PT Assessment Patient needs continued PT services  PT Problem List Decreased strength;Decreased activity tolerance       PT Treatment Interventions DME instruction;Gait training;Therapeutic exercise;Therapeutic activities;Neuromuscular re-education;Patient/family education;Functional mobility training    PT Goals (Current goals can be found in the Care Plan section)  Acute Rehab PT Goals Patient Stated Goal: to improve endurance and activity tolerance PT Goal Formulation: With patient Time For Goal Achievement: 08/04/24 Potential to Achieve Goals: Fair    Frequency Min 2X/week     Co-evaluation               AM-PAC PT 6 Clicks Mobility  Outcome Measure Help needed turning from your back to your side while in a flat bed without using bedrails?: None Help needed moving from lying on your back to sitting on the side of a flat bed without using bedrails?: None Help needed moving to and from a bed to a chair (including a wheelchair)?: None Help needed standing up from a chair using your arms (e.g., wheelchair or bedside chair)?: None Help needed to walk in hospital room?: None Help needed climbing 3-5 steps with a railing? : None 6 Click Score: 24    End of Session   Activity Tolerance: Patient tolerated treatment well;Other (comment) (limited due to  coughing) Patient left: in chair;with call bell/phone within reach   PT Visit Diagnosis: Muscle weakness (generalized) (M62.81);Other (comment) (endurance and O2 precaution)    Time: 8699-8657 PT Time Calculation (min) (ACUTE ONLY): 42 min   Charges:   PT Evaluation $PT Eval Low Complexity: 1 Low PT Treatments $Neuromuscular Re-education: 8-22 mins PT General Charges $$ ACUTE PT VISIT: 1 Visit         Lang Ada, PT, DPT [facility] Office: [phone] 2:11 PM, 08/04/24

## 2024-08-04 NOTE — Discharge Instructions (Addendum)
 Rent/Utilities/Housing Agency Name: [employer] Address: [address] [address] [place], [place] [address] [address]  [place], [place] [address] P Phone Number: [phone] or [phone] For the hearing-impaired - Dial 711 for Relay [place] [employer]  Agency Name: [employer]naldo Haws. Dept. of Health and Human Services Address: [address], [place], [place] [address]  Phone: [phone]  Website: [email or web address]  Services Offered: Temporary financial assistance, subsidized housing, and utility  assistance  Agency Name: [employer] Address: [address], [place], [place] [address]  Phone: [phone] ext. [phone] Email: Contact: info@newrha .org Website: footballpromos.co.nz Services Offered: Subsidized apartment rent based on income.  Agency Name: [facility]  Address: [facility], [address] [place], [place]  Phone: [phone]  Website: www.ccmeden.org Services Offered: Museum/gallery curator, tefl teacher Technical Brewer for all of  [address], [employer], [employer], Office Manager  and [employer] for [employer] area only), rent assistance.  Agency Name: [facility] Address: [address] [place], [place], [place] [address] / [address] [place]  October 28, 2022 13 Phone: [phone] [place] / [phone] [place]  Website: opiniontrades.tn networkaffair.co.za Services Offered: Civil Service Fast Streamer, food, showers, hygiene products utility payment  assistance, thrift shops, rental assistance, Support Groups  Providers Accepting New Patients in [place], [place]    [facility] [address]  [place], [place] [address]J [phone] Accepts most insurances  [facility] [address] [place], [place] [address] [phone] Accepts most insurances  [facility] [address] [place], [place] [address]  [phone] Must  meet requirements  [facility] [address] [place], [place] [address] [phone] Accepts most insurances  [facility] [address]  [place], [place] [address] [phone] Accepts most insurances  [facility] [address]   [place], [place]   [phone] Accepts most insurances  [facility])  [phone]  [place], [place] [address] [phone] Accepts most insurances     [facility] [address]  [place], [place] [address] [phone] Accepts most insurances  [facility] [address] [place], [place] [address] [phone] option 1 Accepts Medicaid and [facility] [address]  [place], [place] [address] [phone] Accepts most insurances  Benita Outhouse, MD [address] [place], [place] [address] [phone] Accepts most insurances  [facility] at [facility] [address]  [place], [place] [address] [phone] Accepts most insurances  [facility] [phone] [address] [place], [place] [address] [phone] Accepts most insurances  [place], [place] [address], [address] [place], [place] [address] [phone]  Accepts most insurances

## 2024-08-04 NOTE — Progress Notes (Signed)
 Pt does not have insurance or PCP. LCSW discussed referral to [facility] and he is agreeable. Referral made.    08/04/24 0845  TOC Brief Assessment  Insurance and Status Lapsed  Patient has primary care physician No (Will make [facility] referral.)  Home environment has been reviewed Lives with girlfriend.  Prior level of function: Independent.  Prior/Current Home Services No current home services  Social Drivers of Health Review SDOH reviewed interventions complete  Readmission risk has been reviewed Yes  Transition of care needs transition of care needs identified, TOC will continue to follow

## 2024-08-05 DIAGNOSIS — Z87891 Personal history of nicotine dependence: Secondary | ICD-10-CM

## 2024-08-05 MED ORDER — GUAIFENESIN ER 600 MG PO TB12: 1200.0000 mg | ORAL_TABLET | Freq: Two times a day (BID) | ORAL | 0 refills | Status: AC

## 2024-08-05 MED ORDER — SPIRIVA RESPIMAT 2.5 MCG/ACT IN AERS: 2.0000 | INHALATION_SPRAY | Freq: Every day | RESPIRATORY_TRACT | 2 refills | Status: AC

## 2024-08-05 MED ORDER — IBUPROFEN 400 MG PO TABS
400.0000 mg | ORAL_TABLET | Freq: Three times a day (TID) | ORAL | Status: DC
  Administered 2024-08-05: 400 mg via ORAL
  Filled 2024-08-05: qty 1

## 2024-08-05 MED ORDER — ALBUTEROL SULFATE HFA 108 (90 BASE) MCG/ACT IN AERS: 2.0000 | INHALATION_SPRAY | RESPIRATORY_TRACT | 0 refills | Status: AC | PRN

## 2024-08-05 MED ORDER — PREDNISONE 10 MG PO TABS: ORAL_TABLET | ORAL | 0 refills | Status: DC

## 2024-08-05 MED ORDER — AZITHROMYCIN 250 MG PO TABS: 250.0000 mg | ORAL_TABLET | Freq: Every day | ORAL | 0 refills | Status: AC

## 2024-08-05 NOTE — Progress Notes (Signed)
 Mobility Specialist Progress Note:    08/05/24 0945  Mobility  Activity Ambulated with assistance  Level of Assistance Independent  Assistive Device None  Distance Ambulated (ft) 300 ft  Range of Motion/Exercises Active;All extremities  Activity Response Tolerated well  Mobility Referral Yes  Mobility visit 1 Mobility  Mobility Specialist Start Time (ACUTE ONLY) 0945  Mobility Specialist Stop Time (ACUTE ONLY) 1005  Mobility Specialist Time Calculation (min) (ACUTE ONLY) 20 min   Pt received in bed, agreeable to mobility. Independently able to stand and ambulate with no AD. Tolerated well, c/o mild SOB. SpO2 95% on RA at rest, SpO2 91% on RA during ambulation. Left sitting EOB, all needs met.  Luken Shadowens Mobility Specialist Please contact via Special Educational Needs Teacher or  Rehab office at [phone]

## 2024-08-05 NOTE — TOC Transition Note (Signed)
 Transition of Care [facility]) - Discharge Note   Patient Details  Name: Julian Rogers MRN: [id number] Date of Birth: 06-19-1966  Transition of Care [facility]) CM/SW Contact:  Noreen KATHEE Pinal, LCSWA Phone Number: 08/05/2024, 11:05 AM   Clinical Narrative:     Patient is DC today home, patient did not want referral sent for OPPT due to not having insurance at the moment . [facility] will follow up with patient regarding his PCP needs. ICM signing off.  Final next level of care: Home/Self Care Barriers to Discharge: Barriers Resolved   Patient Goals and CMS Choice Patient states their goals for this hospitalization and ongoing recovery are:: get well          Discharge Placement                  Name of family member notified: [name] Patient and family notified of of transfer: 08/05/24  Discharge Plan and Services Additional resources added to the After Visit Summary for                  DME Arranged: N/A DME Agency: NA       HH Arranged:  (Patient has no insurance at the moment)          Social Drivers of Health (SDOH) Interventions SDOH Screenings   Food Insecurity: No Food Insecurity (08/03/2024)  Housing: High Risk (08/03/2024)  Transportation Needs: No Transportation Needs (08/03/2024)  Utilities: Not At Risk (08/03/2024)  Financial Resource Strain: Medium Risk (10/09/2023)   Received from [facility]  Physical Activity: Sufficiently Active (10/23/2023)   [facility] from [facility]  Social Connections: Unknown (10/23/2023)   Received from [facility]  Stress: No Stress Concern Present (10/23/2023)   [facility] from [facility]  Tobacco Use: High Risk (08/03/2024)  Health Literacy: Low Risk (10/23/2023)   Received from [facility]     Readmission Risk Interventions     No data to display

## 2024-08-05 NOTE — Progress Notes (Signed)
 Pt states that he is still experiencing a headache after taking the tylenol  and ibuprofen . Pt stated that he informed the MD when he was doing his rounds. Pt is using both the flutter valve and incentive spirometer every hour. Continuing to have a productive cough with clear/yellow sputum in moderate amounts. He walked with the mobility tech around the unit with only a slight complaint of shortness of breath when returning to room that was resolved with rest.

## 2024-08-05 NOTE — Progress Notes (Signed)
 Primary nurse at bedside. During shift assessment pt stated his headache has not gotten better since he received tylenol  and is requesting ibuprofen  MD has been made aware.

## 2024-08-05 NOTE — Discharge Summary (Addendum)
 " Physician Discharge Summary   Patient: Julian Rogers MRN: [id number] DOB: [birth year]  Admit date:     08/03/2024  Discharge date: 08/05/2024  Discharge Physician: Concepcion Riser   PCP: Patient, No Pcp Per   Recommendations at discharge:   PCP follow up in 1 week. Need pulmonary eval for lung nodules follow up.  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 *  Hospital Course: [name] is a 59 year old male with past medical history of COPD, right eye vision loss as a child, history of TIAs, former smoker presented to the emergency department with 2-week history of URI symptoms, worsening wheezing and shortness of breath.  Patient is admitted to hospitalist service for acute COPD exacerbation.   Assessment and Plan: Acute COPD exacerbation- V Solu-Medrol  changed to oral prednisone  taper. Script for 3 days of azithromycin  given. Continue bronchodilators, mucolytics scheduled and as needed. Encouraged incentive spirometry. TOC to arrange for PCP, advised PCP follow up in 1 week.   Acute hypoxic respiratory failure: Due to COPD.  Wean off supplemental oxygen. Encourage out of bed to chair, ambulation.   History of TIA/stroke- Continue aspirin , statin therapy.   Pulmonary nodule- CT chest done showed diffuse emphysematous changes. Right upper lobe solid pulmonary nodule 10 mm in diameter stable. Left upper lobe medial lung nodule measuring 0.5 x 1.8 cm in diameter is stable. Pulmonology evaluation suggested.        Consultants: none Procedures performed: none  Disposition: Home Diet recommendation:  Cardiac diet DISCHARGE MEDICATION: Allergies as of 08/05/2024       Reactions   Penicillins Itching   Has patient had a PCN reaction  causing immediate rash, facial/tongue/throat swelling, SOB or lightheadedness with hypotension: No Has patient had a PCN reaction causing severe rash involving mucus membranes or skin necrosis: No Has patient had a PCN reaction that required hospitalization No Has patient had a PCN reaction occurring within the last 10 years: No If all of the above answers are NO, then may proceed with Cephalosporin use.   Doxycycline  Rash        Medication List     TAKE these medications    albuterol  108 (90 Base) MCG/ACT inhaler Commonly known as: VENTOLIN  HFA Inhale 2 puffs into the lungs every 6 (six) hours as needed for wheezing or shortness of breath.   albuterol  108 (90 Base) MCG/ACT inhaler Commonly known as: VENTOLIN  HFA Inhale 2 puffs into the lungs every 4 (four) hours as needed for wheezing or shortness of breath.   aspirin  81 MG chewable tablet Chew 1 tablet (81 mg total) by mouth daily.   atorvastatin  40 MG tablet Commonly known as: LIPITOR Take 1 tablet (40 mg total) by mouth daily.   azithromycin  250 MG tablet Commonly known as: ZITHROMAX  Take 1 tablet (250 mg total) by mouth daily for 3 days.   clopidogrel  75 MG tablet Commonly known as: PLAVIX  Take 1 tablet (75 mg total) by mouth daily.   guaiFENesin  600 MG 12 hr tablet Commonly known as: MUCINEX  Take 2 tablets (1,200 mg total) by mouth 2 (two) times daily for 10 days.   predniSONE  10 MG tablet Commonly known as: DELTASONE  Take 4 tablets (40 mg total) by mouth daily with breakfast for 2 days, THEN 3 tablets (30 mg total)  daily with breakfast for 2 days, THEN 2 tablets (20 mg total) daily with breakfast for 2 days, THEN 1 tablet (10 mg total) daily with breakfast for 2 days. Start taking on: August 06, 2024   Spiriva  Respimat 2.5 MCG/ACT Aers Generic drug: Tiotropium Bromide  Inhale 2 puffs into the lungs daily.       ASK your doctor about these medications    ipratropium-albuterol  0.5-2.5 (3) MG/3ML  Soln Commonly known as: DUONEB Inhale 3 mLs into the lungs every 6 (six) hours as needed (shortness of breath).        Follow-up Information     [facility] Follow up.   Why: To establish primary care provider. Contact information: [address] [place] [address] [phone]               Discharge Exam: Filed Weights   08/03/24 1044  Weight: 97.5 kg      08/05/2024    5:18 AM 08/04/2024    8:45 PM 08/04/2024   12:31 PM  Vitals with BMI  Systolic 134 135 860  Diastolic 81 86 73  Pulse 74 92 101   General - Middle aged African-American male, no apparent distress HEENT - PERRLA, EOMI, atraumatic head, non tender sinuses. Lung - Clear, no rales, diffuse wheezes. Heart - S1, S2 heard, no murmurs, rubs, no pedal edema. Abdomen - Soft, non tender, bowel sounds good Neuro - Alert, awake and oriented x 3, non focal exam. Skin - Warm and dry.  Condition at discharge: stable  The results of significant diagnostics from this hospitalization (including imaging, microbiology, ancillary and laboratory) are listed below for reference.   Imaging Studies: CT CHEST W CONTRAST Result Date: 08/03/2024 EXAM: CT CHEST WITH CONTRAST 08/03/2024 07:10:56 PM TECHNIQUE: CT of the chest was performed with the administration of 80 mL of iohexol  (OMNIPAQUE ) 300 MG/ML solution. Multiplanar reformatted images are provided for review. Automated exposure control, iterative reconstruction, and/or weight based adjustment of the mA/kV was utilized to reduce the radiation dose to as low as reasonably achievable. COMPARISON: Chest radiograph 08/03/2024 and CT chest 01/13/2022. CLINICAL HISTORY: Abnormal X-ray showing lung nodule, greater than or equal to 1 cm. Shortness of breath for 3 days. FINDINGS: MEDIASTINUM: Heart and pericardium are unremarkable. Normal heart size. No pericardial effusions. Normal caliber thoracic aorta. The central airways are clear. The thyroid gland is unremarkable. The  esophagus is decompressed. LYMPH NODES: No mediastinal, hilar or axillary lymphadenopathy. LUNGS AND PLEURA: Circumscribed right upper lobe solid pulmonary nodule measuring 10 mm in diameter. Series 4 image 30. No change in size or appearance since the previous study. Left upper lobe medial lung nodule measuring 0.5 x 1.8 cm diameter is also unchanged since prior study. No airspace disease or consolidation in the lungs. Scattered diffuse emphysematous changes throughout the lungs. Scarring in the lung bases. No pleural effusion or pneumothorax. Bronchial wall thickening consistent with chronic bronchitis. SOFT TISSUES/BONES: No acute abnormality of the bones or soft tissues. No acute bony abnormalities or focal bone lesions. UPPER ABDOMEN: Limited images of the upper abdomen demonstrates no acute abnormality. No acute abnormalities demonstrated in the visualized upper abdomen. IMPRESSION: 1. No acute intrathoracic abnormality to explain shortness of breath. 2. Scattered diffuse emphysematous changes throughout the lungs, which is an independent risk factor for lung cancer and warrants consideration for evaluation for a low-dose CT lung cancer screening program. 3. Bronchial wall thickening consistent with chronic bronchitis. 4. Stable circumscribed right upper lobe solid pulmonary nodule measuring 10 mm  in diameter. 5. Stable left upper lobe medial lung nodule measuring 0.5 by 1.8 centimeters in diameter. Electronically signed by: Elsie Gravely MD 08/03/2024 07:37 PM EST RP Workstation: [id number]   DG Chest Port 1 View Result Date: 08/03/2024 CLINICAL DATA:  sob, wheezing EXAM: PORTABLE CHEST - 1 VIEW COMPARISON:  01/19/2022 FINDINGS: Cardiomediastinal silhouette and pulmonary vasculature are within normal limits. Known RIGHT upper lobe pulmonary nodule is better seen on prior CT from 01/13/2022. Lungs are otherwise clear. IMPRESSION: 1. No acute cardiopulmonary process. 2. Nonemergent contrast enhanced chest  CT should be performed for follow-up of previously seen RIGHT upper lobe pulmonary nodule best visualized on CT angiography of the chest from 01/13/2022. Electronically Signed   By: Aliene Lloyd M.D.   On: 08/03/2024 11:21    Microbiology: Results for orders placed or performed during the hospital encounter of 08/03/24  Resp panel by RT-PCR (RSV, Flu A&B, Covid) Anterior Nasal Swab     Status: None   Collection Time: 08/03/24 10:41 AM   Specimen: Anterior Nasal Swab  Result Value Ref Range Status   SARS Coronavirus 2 by RT PCR NEGATIVE NEGATIVE Final    Comment: (NOTE) SARS-CoV-2 target nucleic acids are NOT DETECTED.  The SARS-CoV-2 RNA is generally detectable in upper respiratory specimens during the acute phase of infection. The lowest concentration of SARS-CoV-2 viral copies this assay can detect is 138 copies/mL. A negative result does not preclude SARS-Cov-2 infection and should not be used as the sole basis for treatment or other patient management decisions. A negative result may occur with  improper specimen collection/handling, submission of specimen other than nasopharyngeal swab, presence of viral mutation(s) within the areas targeted by this assay, and inadequate number of viral copies(<138 copies/mL). A negative result must be combined with clinical observations, patient history, and epidemiological information. The expected result is Negative.  Fact Sheet for Patients:  bloggercourse.com  Fact Sheet for Healthcare Providers:  seriousbroker.it  This test is no t yet approved or cleared by the [place]  FDA and  has been authorized for detection and/or diagnosis of SARS-CoV-2 by FDA under an Emergency Use Authorization (EUA). This EUA will remain  in effect (meaning this test can be used) for the duration of the COVID-19 declaration under Section 564(b)(1) of the Act, 21 U.S.C.section 360bbb-3(b)(1), unless the  authorization is terminated  or revoked sooner.       Influenza A by PCR NEGATIVE NEGATIVE Final   Influenza B by PCR NEGATIVE NEGATIVE Final    Comment: (NOTE) The Xpert Xpress SARS-CoV-2/FLU/RSV plus assay is intended as an aid in the diagnosis of influenza from Nasopharyngeal swab specimens and should not be used as a sole basis for treatment. Nasal washings and aspirates are unacceptable for Xpert Xpress SARS-CoV-2/FLU/RSV testing.  Fact Sheet for Patients: bloggercourse.com  Fact Sheet for Healthcare Providers: seriousbroker.it  This test is not yet approved or cleared by the [place]  FDA and has been authorized for detection and/or diagnosis of SARS-CoV-2 by FDA under an Emergency Use Authorization (EUA). This EUA will remain in effect (meaning this test can be used) for the duration of the COVID-19 declaration under Section 564(b)(1) of the Act, 21 U.S.C. section 360bbb-3(b)(1), unless the authorization is terminated or revoked.     Resp Syncytial Virus by PCR NEGATIVE NEGATIVE Final    Comment: (NOTE) Fact Sheet for Patients: bloggercourse.com  Fact Sheet for Healthcare Providers: seriousbroker.it  This test is not yet approved or cleared by the [place]  FDA and has  been authorized for detection and/or diagnosis of SARS-CoV-2 by FDA under an Emergency Use Authorization (EUA). This EUA will remain in effect (meaning this test can be used) for the duration of the COVID-19 declaration under Section 564(b)(1) of the Act, 21 U.S.C. section 360bbb-3(b)(1), unless the authorization is terminated or revoked.  Performed at [facility], [address] [place], [place] [address]     Labs: CBC: Recent Labs  Lab 08/03/24 1058  WBC 4.0  NEUTROABS 1.3*  HGB 15.3  HCT 46.0  MCV 88.0  PLT 223   Basic Metabolic Panel: Recent Labs  Lab 08/03/24 1058  NA  141  K 3.7  CL 105  CO2 25  GLUCOSE 93  BUN 12  CREATININE 1.06  CALCIUM  9.1   Liver Function Tests: No results for input(s): AST, ALT, ALKPHOS, BILITOT, PROT, ALBUMIN in the last 168 hours. CBG: No results for input(s): GLUCAP in the last 168 hours.  Discharge time spent: 35 minutes.  Signed: Concepcion Riser, MD [facility] 08/06/2024 "

## 2024-08-05 NOTE — Progress Notes (Signed)
 Discharge is in for Julian Rogers. Paperwork has been reviewed and no questions or concerns were brought to my attention. Both IV's were removed. He states that he does not have a ride, [facility] been [facility] and states they will be here within the hour.

## 2024-08-06 DIAGNOSIS — R918 Other nonspecific abnormal finding of lung field: Secondary | ICD-10-CM | POA: Insufficient documentation

## 2024-08-10 ENCOUNTER — Other Ambulatory Visit: Payer: Self-pay

## 2024-08-10 ENCOUNTER — Emergency Department (HOSPITAL_COMMUNITY): Payer: Self-pay

## 2024-08-10 ENCOUNTER — Encounter (HOSPITAL_COMMUNITY): Payer: Self-pay

## 2024-08-10 ENCOUNTER — Observation Stay (HOSPITAL_COMMUNITY)
Admission: EM | Admit: 2024-08-10 | Discharge: 2024-08-12 | Disposition: A | Payer: Self-pay | Attending: Emergency Medicine | Admitting: Emergency Medicine

## 2024-08-10 DIAGNOSIS — Z7902 Long term (current) use of antithrombotics/antiplatelets: Secondary | ICD-10-CM | POA: Insufficient documentation

## 2024-08-10 DIAGNOSIS — Z8673 Personal history of transient ischemic attack (TIA), and cerebral infarction without residual deficits: Secondary | ICD-10-CM | POA: Insufficient documentation

## 2024-08-10 DIAGNOSIS — R000 Tachycardia, unspecified: Secondary | ICD-10-CM | POA: Diagnosis present

## 2024-08-10 DIAGNOSIS — F17210 Nicotine dependence, cigarettes, uncomplicated: Secondary | ICD-10-CM | POA: Insufficient documentation

## 2024-08-10 DIAGNOSIS — R911 Solitary pulmonary nodule: Secondary | ICD-10-CM | POA: Insufficient documentation

## 2024-08-10 DIAGNOSIS — Z7982 Long term (current) use of aspirin: Secondary | ICD-10-CM | POA: Insufficient documentation

## 2024-08-10 DIAGNOSIS — H5440 Blindness, one eye, unspecified eye: Secondary | ICD-10-CM | POA: Diagnosis present

## 2024-08-10 DIAGNOSIS — Z79899 Other long term (current) drug therapy: Secondary | ICD-10-CM | POA: Insufficient documentation

## 2024-08-10 DIAGNOSIS — R918 Other nonspecific abnormal finding of lung field: Secondary | ICD-10-CM | POA: Diagnosis present

## 2024-08-10 DIAGNOSIS — J441 Chronic obstructive pulmonary disease with (acute) exacerbation: Principal | ICD-10-CM | POA: Diagnosis present

## 2024-08-10 LAB — BASIC METABOLIC PANEL WITH GFR
Anion gap: 16 — ABNORMAL HIGH (ref 5–15)
BUN: 16 mg/dL (ref 6–20)
CO2: 23 mmol/L (ref 22–32)
Calcium: 8.9 mg/dL (ref 8.9–10.3)
Chloride: 103 mmol/L (ref 98–111)
Creatinine, Ser: 0.99 mg/dL (ref 0.61–1.24)
GFR, Estimated: >60 mL/min
Glucose, Bld: 90 mg/dL (ref 70–99)
Potassium: 3.8 mmol/L (ref 3.5–5.1)
Sodium: 141 mmol/L (ref 135–145)

## 2024-08-10 LAB — CBC
HCT: 47.1 % (ref 39.0–52.0)
Hemoglobin: 15.7 g/dL (ref 13.0–17.0)
MCH: 29.3 pg (ref 26.0–34.0)
MCHC: 33.3 g/dL (ref 30.0–36.0)
MCV: 87.9 fL (ref 80.0–100.0)
Platelets: 291 10*3/uL (ref 150–400)
RBC: 5.36 MIL/uL (ref 4.22–5.81)
RDW: 13.5 % (ref 11.5–15.5)
WBC: 7 10*3/uL (ref 4.0–10.5)
nRBC: 0 % (ref 0.0–0.2)

## 2024-08-10 MED ORDER — ALBUTEROL SULFATE (2.5 MG/3ML) 0.083% IN NEBU: 2.5000 mg | INHALATION_SOLUTION | Freq: Four times a day (QID) | RESPIRATORY_TRACT | 0 refills | Status: AC | PRN

## 2024-08-10 MED ORDER — ALBUTEROL SULFATE HFA 108 (90 BASE) MCG/ACT IN AERS
2.0000 | INHALATION_SPRAY | RESPIRATORY_TRACT | Status: DC | PRN
  Filled 2024-08-10: qty 6.7

## 2024-08-10 MED ORDER — ALBUTEROL SULFATE (2.5 MG/3ML) 0.083% IN NEBU
5.0000 mg | INHALATION_SOLUTION | Freq: Once | RESPIRATORY_TRACT | Status: AC
  Administered 2024-08-10: 5 mg via RESPIRATORY_TRACT
  Filled 2024-08-10: qty 6

## 2024-08-10 MED ORDER — ALBUTEROL SULFATE (2.5 MG/3ML) 0.083% IN NEBU
2.5000 mg | INHALATION_SOLUTION | Freq: Once | RESPIRATORY_TRACT | Status: AC
  Administered 2024-08-10: 2.5 mg via RESPIRATORY_TRACT
  Filled 2024-08-10: qty 3

## 2024-08-10 NOTE — ED Triage Notes (Signed)
 Patient BIB [facility] for shortness of breath Hx of COPD unable to get his duonebs that are on auto refill. EMS vitals BP 160/83, pulse 125,  88% RA with distress with productive cough that's clear,  2.5 albuterol , 0.5 Atrovent, 125 solumedrol given by EMS. Patient stated has been severe since 4pm.

## 2024-08-10 NOTE — ED Notes (Signed)
 CCMD notified

## 2024-08-10 NOTE — ED Notes (Signed)
 Patient transported to X-ray

## 2024-08-11 ENCOUNTER — Observation Stay (HOSPITAL_COMMUNITY): Payer: Self-pay

## 2024-08-11 ENCOUNTER — Encounter (HOSPITAL_COMMUNITY): Payer: Self-pay | Admitting: Internal Medicine

## 2024-08-11 DIAGNOSIS — J441 Chronic obstructive pulmonary disease with (acute) exacerbation: Principal | ICD-10-CM | POA: Diagnosis present

## 2024-08-11 LAB — HIV ANTIBODY (ROUTINE TESTING W REFLEX): HIV Screen 4th Generation wRfx: NONREACTIVE

## 2024-08-11 MED ORDER — LORAZEPAM 2 MG/ML IJ SOLN
0.5000 mg | Freq: Once | INTRAMUSCULAR | Status: AC
  Administered 2024-08-11: 0.5 mg via INTRAVENOUS
  Filled 2024-08-11: qty 1

## 2024-08-11 MED ORDER — ACETAMINOPHEN 325 MG PO TABS
650.0000 mg | ORAL_TABLET | Freq: Four times a day (QID) | ORAL | Status: DC | PRN
  Administered 2024-08-11 – 2024-08-12 (×2): 650 mg via ORAL
  Filled 2024-08-11 (×3): qty 2

## 2024-08-11 MED ORDER — ENOXAPARIN SODIUM 40 MG/0.4ML IJ SOSY
40.0000 mg | PREFILLED_SYRINGE | INTRAMUSCULAR | Status: DC
  Filled 2024-08-11 (×2): qty 0.4

## 2024-08-11 MED ORDER — ALBUTEROL SULFATE (2.5 MG/3ML) 0.083% IN NEBU: 2.5000 mg | INHALATION_SOLUTION | RESPIRATORY_TRACT | Status: DC | PRN

## 2024-08-11 MED ORDER — ASPIRIN 81 MG PO CHEW
81.0000 mg | CHEWABLE_TABLET | Freq: Every day | ORAL | Status: DC
  Administered 2024-08-11 – 2024-08-12 (×2): 81 mg via ORAL
  Filled 2024-08-11 (×2): qty 1

## 2024-08-11 MED ORDER — IPRATROPIUM-ALBUTEROL 0.5-2.5 (3) MG/3ML IN SOLN
3.0000 mL | Freq: Three times a day (TID) | RESPIRATORY_TRACT | Status: DC
  Administered 2024-08-11 (×2): 3 mL via RESPIRATORY_TRACT
  Filled 2024-08-11 (×2): qty 3

## 2024-08-11 MED ORDER — SODIUM CHLORIDE 0.9% FLUSH: 3.0000 mL | Freq: Two times a day (BID) | INTRAVENOUS | Status: DC

## 2024-08-11 MED ORDER — AZITHROMYCIN 250 MG PO TABS
500.0000 mg | ORAL_TABLET | Freq: Once | ORAL | Status: AC
  Administered 2024-08-11: 500 mg via ORAL
  Filled 2024-08-11: qty 2

## 2024-08-11 MED ORDER — METHYLPREDNISOLONE SODIUM SUCC 125 MG IJ SOLR
80.0000 mg | Freq: Once | INTRAMUSCULAR | Status: AC
  Administered 2024-08-11: 80 mg via INTRAVENOUS
  Filled 2024-08-11: qty 2

## 2024-08-11 MED ORDER — ATORVASTATIN CALCIUM 40 MG PO TABS
40.0000 mg | ORAL_TABLET | Freq: Every day | ORAL | Status: DC
  Administered 2024-08-11 – 2024-08-12 (×2): 40 mg via ORAL
  Filled 2024-08-11 (×2): qty 1

## 2024-08-11 MED ORDER — METHYLPREDNISOLONE SODIUM SUCC 40 MG IJ SOLR
40.0000 mg | Freq: Every day | INTRAMUSCULAR | Status: DC
  Administered 2024-08-12: 40 mg via INTRAVENOUS
  Filled 2024-08-11: qty 1

## 2024-08-11 MED ORDER — SODIUM CHLORIDE 0.9% FLUSH
3.0000 mL | Freq: Two times a day (BID) | INTRAVENOUS | Status: DC
  Administered 2024-08-11 – 2024-08-12 (×4): 3 mL via INTRAVENOUS

## 2024-08-11 MED ORDER — IPRATROPIUM-ALBUTEROL 0.5-2.5 (3) MG/3ML IN SOLN
3.0000 mL | Freq: Once | RESPIRATORY_TRACT | Status: AC
  Administered 2024-08-11: 3 mL via RESPIRATORY_TRACT
  Filled 2024-08-11: qty 3

## 2024-08-11 MED ORDER — IPRATROPIUM-ALBUTEROL 0.5-2.5 (3) MG/3ML IN SOLN
3.0000 mL | Freq: Four times a day (QID) | RESPIRATORY_TRACT | Status: DC
  Administered 2024-08-12 (×2): 3 mL via RESPIRATORY_TRACT
  Filled 2024-08-11 (×2): qty 3

## 2024-08-11 MED ORDER — IBUPROFEN 400 MG PO TABS
600.0000 mg | ORAL_TABLET | Freq: Four times a day (QID) | ORAL | Status: DC | PRN
  Administered 2024-08-11 (×2): 600 mg via ORAL
  Filled 2024-08-11 (×2): qty 2

## 2024-08-11 MED ORDER — ALBUTEROL SULFATE (2.5 MG/3ML) 0.083% IN NEBU: 5.0000 mg | INHALATION_SOLUTION | Freq: Four times a day (QID) | RESPIRATORY_TRACT | Status: DC

## 2024-08-11 NOTE — Progress Notes (Signed)
 Patient has no PCP, no Insurance, connected with [facility] last week.  PCP list added to AVS and [facility] information given again for follow up.  Patient in ED in OBS, after neb treatment, planning to discharge home.      08/11/24 0946  TOC Brief Assessment  Insurance and Status Lapsed  Patient has primary care physician No (PCP list added)  Home environment has been reviewed Lives with girlfriend  Prior level of function: Independent  Prior/Current Home Services No current home services  Social Drivers of Health Review SDOH reviewed no interventions necessary  Readmission risk has been reviewed Yes  Transition of care needs no transition of care needs at this time

## 2024-08-11 NOTE — ED Notes (Signed)
 This RN made RT aware that upon the pts head to toe assessment with this RN the pt endorsed feeling increasing Sob and I heard expiratory wheezing in all lobes. RT endorsed that they will come down and give him a neb treatment.

## 2024-08-11 NOTE — H&P (Signed)
 " History and Physical    Patient: Julian Rogers FMW:[id number] DOB: 03-Mar-1966 DOA: 08/10/2024 DOS: the patient was seen and examined on 08/11/2024 PCP: Patient, No Pcp Per  Patient coming from: Home  Chief Complaint:  Chief Complaint  Patient presents with   Shortness of Breath   HPI: Julian Rogers is a 59 y.o. male with a history of COPD, TIA, former smoker, recent admission (1/27 - 1/29) for AECOPD who presented to the ED last night with shortness of breath. EMS had found SpO2 in 80%'s. He reported gradual re-worsening of wheezing, dyspnea since discharge from hospital. Didn't have any albuterol  nebulizer solution but does have nebulizer at home. No fevers. +increasing cough and sputum. His chest was tight but improved significantly after bronchodilator treatment in the ED. When he stood up to walk he got dizzy and felt more short of breath than his baseline, so admission was requested. He did not have oxygen desaturations.   This morning he reports overall sustained improvement, confirms that albuterol  nebulizer solution is to be delivered to his home today (sent in by EDP last night). He hasn't had any neb since 8pm last night and is starting to feel a bit tight again.   Review of Systems: As mentioned in the history of present illness. All other systems reviewed and are negative. 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 He has a 12.5 pack-year smoking history. He has never used smokeless tobacco. He reports current alcohol use. He reports that he does not use drugs.  Allergies[1]  Family History  Problem Relation Age of Onset   Cancer Mother     Prior to Admission medications  Medication Sig Start Date End  Date Taking? Authorizing Provider  albuterol  (PROVENTIL ) (2.5 MG/3ML) 0.083% nebulizer solution Take 3 mLs (2.5 mg total) by nebulization every 6 (six) hours as needed for wheezing or shortness of breath. 08/10/24  Yes Dean Clarity, MD  albuterol  (VENTOLIN  HFA) 108 (90 Base) MCG/ACT inhaler Inhale 2 puffs into the lungs every 6 (six) hours as needed for wheezing or shortness of breath. 01/14/22   Jinwala, Sagar H, MD  albuterol  (VENTOLIN  HFA) 108 (90 Base) MCG/ACT inhaler Inhale 2 puffs into the lungs every 4 (four) hours as needed for wheezing or shortness of breath. 08/05/24   Darci Pore, MD  aspirin  81 MG chewable tablet Chew 1 tablet (81 mg total) by mouth daily. 01/15/22   Jinwala, Sagar H, MD  atorvastatin  (LIPITOR) 40 MG tablet Take 1 tablet (40 mg total) by mouth daily. 01/15/22   Jinwala, Sagar H, MD  clopidogrel  (PLAVIX ) 75 MG tablet Take 1 tablet (75 mg total) by mouth daily. Patient not taking: Reported on 08/05/2024 01/15/22   Jinwala, Sagar H, MD  guaiFENesin  (MUCINEX ) 600 MG 12 hr tablet Take 2 tablets (1,200 mg total) by mouth 2 (two) times daily for 10 days. 08/05/24 08/15/24  Darci Pore, MD  ipratropium-albuterol  (DUONEB) 0.5-2.5 (3) MG/3ML SOLN Inhale 3 mLs into the lungs every 6 (six) hours as needed (shortness of breath). 03/09/24   [provider]  predniSONE  (DELTASONE ) 10 MG tablet Take 4 tablets (40 mg total) by mouth daily with  breakfast for 2 days, THEN 3 tablets (30 mg total) daily with breakfast for 2 days, THEN 2 tablets (20 mg total) daily with breakfast for 2 days, THEN 1 tablet (10 mg total) daily with breakfast for 2 days. 08/06/24 08/14/24  Darci Pore, MD  Tiotropium Bromide  (SPIRIVA  RESPIMAT) 2.5 MCG/ACT AERS Inhale 2 puffs into the lungs daily. 08/05/24   Darci Pore, MD    Physical Exam: Vitals:   08/11/24 0200 08/11/24 0300 08/11/24 0400 08/11/24 0433  BP: (!) 141/85 135/87 (!) 145/82   Pulse: (!) 108 100 (!) 105   Resp:  (!) 22     Temp:    98.4 F (36.9 C)  TempSrc:    Oral  SpO2: 96% 94% 96%   Weight:      Height:      Gen: No distress, tall, well-appearing HEENT: Glass eye on right, eyelid down but no true ptosis.  Pulm: Nonlabored, end-expiratory wheeze at apices, no crackles or other adventitious sounds. Moving great air bilaterally.   CV: Regular, rate is 80's at rest, no edema GI: Soft, NT, ND, +BS  Neuro: Alert and oriented. No new focal deficits. Ext: Warm, no deformities. Skin: No rashes, lesions or ulcers on visualized skin   Data Reviewed: Troponin 11, BNP undetectable, BMP and CBC unremarkable.  CXR (personal interpretation): hyperinflated without infiltrate, normal cardiomediastinal silhouette.   ECG (personal interpretation): ST with a PAC, nonspecific ST changes. Note automatic interpretation mentions acute pericarditis which does not fit the clinical scenario.     Assessment and Plan: AECOPD: Primarily due to recurrent bronchospasm after completing steroids and no longer having albuterol  neb at home. CXR and other work up reassuring. Admission called for dizziness and dyspnea on exertion without hypoxemia.  - This morning the patient reports improvements but hasn't had a neb for 12 hours. Will reorder duoneb (discussed with RN), solumedrol given his slight bronchospasm now, azithromycin  given his increasing sputum volume and purulence despite negative WBC, Hx, and infiltrate.  - Reevaluate after this for discharge.   Home meds to be continued.    Advance Care Planning: Full code  Consults: None  Family Communication: None  Severity of Illness: The appropriate patient status for this patient is OBSERVATION. Observation status is judged to be reasonable and necessary in order to provide the required intensity of service to ensure the patient's safety. The patient's presenting symptoms, physical exam findings, and initial radiographic and laboratory data in the context of their  medical condition is felt to place them at decreased risk for further clinical deterioration. Furthermore, it is anticipated that the patient will be medically stable for discharge from the hospital within 2 midnights of admission.   Author: Bernardino KATHEE Come, MD 08/11/2024 7:42 AM  For on call review [email or web address].     [1]  Allergies Allergen Reactions   Penicillins Itching    Has patient had a PCN reaction causing immediate rash, facial/tongue/throat swelling, SOB or lightheadedness with hypotension: No Has patient had a PCN reaction causing severe rash involving mucus membranes or skin necrosis: No Has patient had a PCN reaction that required hospitalization No Has patient had a PCN reaction occurring within the last 10 years: No If all of the above answers are NO, then may proceed with Cephalosporin use.    Doxycycline  Rash   "

## 2024-08-11 NOTE — ED Notes (Signed)
 Pt ambulated to restroom independently with steady gait & back to ED stretcher. Denies any complaints at this time.

## 2024-08-11 NOTE — Discharge Instructions (Signed)

## 2024-08-11 NOTE — ED Provider Notes (Signed)
 60  Spoke with Blondie, NP with Hospitalist who will evaluate for admission.    Roselyn Carlin NOVAK, MD 08/11/24 [phone]

## 2024-08-12 ENCOUNTER — Encounter: Payer: Self-pay | Admitting: Internal Medicine

## 2024-08-12 MED ORDER — ASPIRIN 81 MG PO CHEW: 81.0000 mg | CHEWABLE_TABLET | Freq: Every day | ORAL | 0 refills | Status: AC

## 2024-08-12 MED ORDER — AZITHROMYCIN 250 MG PO TABS: ORAL_TABLET | ORAL | 0 refills | Status: AC

## 2024-08-12 MED ORDER — ATORVASTATIN CALCIUM 40 MG PO TABS: 40.0000 mg | ORAL_TABLET | Freq: Every day | ORAL | 0 refills | Status: AC

## 2024-08-12 MED ORDER — PREDNISONE 20 MG PO TABS: 40.0000 mg | ORAL_TABLET | Freq: Every day | ORAL | 0 refills | Status: AC

## 2024-08-12 NOTE — ED Notes (Signed)
 [facility] called for [facility] to be transferred home.

## 2024-08-12 NOTE — H&P (Signed)
 Physical Therapy Evaluation Patient Details Name: Julian Rogers MRN: [id number] DOB: 1965/09/18 Today's Date: 08/12/2024  History of Present Illness  Julian Rogers is a 59 y.o. male with a history of COPD, TIA, former smoker, recent admission (1/27 - 1/29) for AECOPD who presented to the ED last night with shortness of breath. EMS had found SpO2 in 80%'s. He reported gradual re-worsening of wheezing, dyspnea since discharge from hospital. Didn't have any albuterol  nebulizer solution but does have nebulizer at home. No fevers. +increasing cough and sputum. His chest was tight but improved significantly after bronchodilator treatment in the ED. When he stood up to walk he got dizzy and felt more short of breath than his baseline, so admission was requested. He did not have oxygen desaturations.      This morning he reports overall sustained improvement, confirms that albuterol  nebulizer solution is to be delivered to his home today (sent in by EDP last night). He hasn't had any neb since 8pm last night and is starting to feel a bit tight again.   Clinical Impression  Patient negative for nystagmus or c/o vertigo during left/right Hal-pike Dix maneuvers. Patient functioning at baseline for functional mobility and gait demonstrating good return for ambulating in room, hallways without loss of balance or need for an AD. Plan:  Patient discharged from physical therapy to care of nursing for ambulation daily as tolerated for length of stay.       If plan is discharge home, recommend the following: Help with stairs or ramp for entrance   Can travel by private vehicle        Equipment Recommendations None recommended by PT  Recommendations for Other Services       Functional Status Assessment Patient has had a recent decline in their functional status and/or demonstrates limited ability to make significant improvements in function in a reasonable and predictable amount of time     Precautions /  Restrictions Precautions Precautions: None Restrictions Weight Bearing Restrictions Per Provider Order: No      Mobility  Bed Mobility Overal bed mobility: Independent                  Transfers Overall transfer level: Independent                      Ambulation/Gait Ambulation/Gait assistance: Modified independent (Device/Increase time), Independent Gait Distance (Feet): 120 Feet Assistive device: None Gait Pattern/deviations: WFL(Within Functional Limits) Gait velocity: near normal     General Gait Details: grossly WFL with good return for ambulating in room, hallways without loss of balance or need for an AD  Stairs            Wheelchair Mobility     Tilt Bed    Modified Rankin (Stroke Patients Only)       Balance Overall balance assessment: Modified Independent                                           Pertinent Vitals/Pain Pain Assessment Pain Assessment: Faces Faces Pain Scale: Hurts little more Pain Location: neck with end range extension Pain Descriptors / Indicators: Discomfort, Sore Pain Intervention(s): Limited activity within patient's tolerance, Monitored during session, Repositioned    Home Living Family/patient expects to be discharged to:: Private residence Living Arrangements: Spouse/significant other Available Help at Discharge: Family;Available 24 hours/day Type of Home: Apartment Home  Access: Level entry       Home Layout: One level Home Equipment: Agricultural Consultant (2 wheels);Shower seat;Grab bars - tub/shower      Prior Function Prior Level of Function : Independent/Modified Independent;Driving             Mobility Comments: Community ambulation without AD, drives, shops ADLs Comments: Independent     Extremity/Trunk Assessment   Upper Extremity Assessment Upper Extremity Assessment: Overall WFL for tasks assessed    Lower Extremity Assessment Lower Extremity Assessment: Overall  WFL for tasks assessed    Cervical / Trunk Assessment Cervical / Trunk Assessment: Kyphotic  Communication   Communication Communication: No apparent difficulties    Cognition Arousal: Alert Behavior During Therapy: WFL for tasks assessed/performed                             Following commands: Intact       Cueing Cueing Techniques: Verbal cues     General Comments      Exercises     Assessment/Plan    PT Assessment Patient does not need any further PT services  PT Problem List         PT Treatment Interventions      PT Goals (Current goals can be found in the Care Plan section)  Acute Rehab PT Goals Patient Stated Goal: return home with friedn to assist PT Goal Formulation: With patient Time For Goal Achievement: 08/12/24 Potential to Achieve Goals: Good    Frequency       Co-evaluation               AM-PAC PT 6 Clicks Mobility  Outcome Measure Help needed turning from your back to your side while in a flat bed without using bedrails?: None Help needed moving from lying on your back to sitting on the side of a flat bed without using bedrails?: None Help needed moving to and from a bed to a chair (including a wheelchair)?: None Help needed standing up from a chair using your arms (e.g., wheelchair or bedside chair)?: None Help needed to walk in hospital room?: None Help needed climbing 3-5 steps with a railing? : A Little 6 Click Score: 23    End of Session   Activity Tolerance: Patient tolerated treatment well Patient left: in bed;with call bell/phone within reach Nurse Communication: Mobility status PT Visit Diagnosis: Unsteadiness on feet (R26.81);Other abnormalities of gait and mobility (R26.89);Muscle weakness (generalized) (M62.81)    Time: 9092-9072 PT Time Calculation (min) (ACUTE ONLY): 20 min   Charges:   PT Evaluation $PT Eval Moderate Complexity: 1 Mod PT Treatments $Therapeutic Activity: 8-22 mins PT General  Charges $$ ACUTE PT VISIT: 1 Visit         11:58 AM, 08/12/24 Lynwood Music, MPT Physical Therapist with [facility] [address] [phone] office [phone] mobile phone

## 2024-08-12 NOTE — Discharge Summary (Signed)
 " Physician Discharge Summary   Patient: Julian Rogers MRN: [id number] DOB: [birth year]  Admit date:     08/10/2024  Discharge date: 08/12/24  Discharge Physician: Bernardino KATHEE Come   PCP: Patient, No Pcp Per   Recommendations at discharge:  Take the antibiotic, azithromycin , as directed for 5 days. Complete the steroid burst over the next 5 days, and continue using albuterol  as needed for wheezing or shortness of breath. You can take tylenol  and/or ibuprofen  for headache.  - Because of your history of strokes, you should continue taking aspirin  and atorvastatin  to lower your risk of a stroke in the future (these were prescribed as well). You did not have a stroke on your MRI yesterday.  - Call to arrange follow up with a primary care provider after discharge, or seek medical attention sooner if your symptoms worsen. 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 Had some dizziness which is improved and negative MRI brain. On exam has some effusion in L > R ears with inflammation.   Has albuterol  neb at home, giving ongoing steroid and abx.   Consultants: None Procedures performed: None  Disposition: Home Diet recommendation: As tolerated DISCHARGE MEDICATION: Allergies as of 08/12/2024       Reactions   Penicillins Itching   Has patient had a PCN reaction causing immediate rash, facial/tongue/throat swelling, SOB or lightheadedness with hypotension: No Has patient had a PCN reaction causing severe rash involving mucus membranes or skin necrosis: No Has patient had a PCN reaction that required hospitalization No Has patient had a PCN reaction occurring within the last 10 years: No If all of the above answers are NO, then may proceed  with Cephalosporin use.   Doxycycline  Rash        Medication List     TAKE these medications    albuterol  108 (90 Base) MCG/ACT inhaler Commonly known as: VENTOLIN  HFA Inhale 2 puffs into the lungs every 4 (four) hours as needed for wheezing or shortness of breath. What changed: Another medication with the same name was added. Make sure you understand how and when to take each.   albuterol  (2.5 MG/3ML) 0.083% nebulizer solution Commonly known as: PROVENTIL  Take 3 mLs (2.5 mg total) by nebulization every 6 (six) hours as needed for wheezing or shortness of breath. What changed: You were already taking a medication with the same name, and this prescription was added. Make sure you understand how and when to take each.   aspirin  81 MG chewable tablet Chew 1 tablet (81 mg total) by mouth daily.   atorvastatin  40 MG tablet Commonly known as: LIPITOR Take 1 tablet (40 mg total) by mouth daily.   azithromycin  250 MG tablet Commonly known as: ZITHROMAX  Take 2 tablets by mouth today, take 1 tablet daily for the next 4 days.   guaiFENesin  600 MG 12 hr tablet Commonly known as: MUCINEX  Take 2 tablets (1,200 mg total) by mouth 2 (two) times daily for 10 days.   ipratropium-albuterol  0.5-2.5 (3) MG/3ML Soln Commonly known as: DUONEB Inhale 3 mLs into the lungs every 6 (six) hours as needed (shortness of breath).   predniSONE  20 MG tablet Commonly known as: DELTASONE  Take 2 tablets (40 mg total) by mouth daily with breakfast for 3  days. What changed:  medication strength See the new instructions.   Spiriva  Respimat 2.5 MCG/ACT Aers Generic drug: Tiotropium Bromide  Inhale 2 puffs into the lungs daily.        Follow-up Information     [facility]. Call.   Why: Call for follow up appointment. Contact information: [address] [place], [place] [address] [phone]               Discharge Exam: Fredricka Weights   08/10/24 1731  Weight: 97.5 kg  Well-appearing male  in no distress R eye prosthetic. L eye with injected conjunctiva, acuity wnl, nonpainful EOMI L TM injected, and clear effusion behind both TMs.  Clear, nonlabored  Condition at discharge: stable  The results of significant diagnostics from this hospitalization (including imaging, microbiology, ancillary and laboratory) are listed below for reference.   Imaging Studies: MR BRAIN WO CONTRAST Result Date: 08/11/2024 EXAM: MRI BRAIN WITHOUT CONTRAST 08/11/2024 02:29:13 PM TECHNIQUE: Multiplanar multisequence MRI of the head/brain was performed without the administration of intravenous contrast. COMPARISON: Same day head CT. CLINICAL HISTORY: Neuro deficit, acute, stroke suspected; Stroke, follow up; dizziness, infarct on CT. Acute neurologic deficit; stroke suspected. Stroke, follow-up. Dizziness; infarct on CT. FINDINGS: BRAIN AND VENTRICLES: There is no evidence of acute infarct in the right cerebellum. No intracranial hemorrhage. No mass. No midline shift. No hydrocephalus. The sella is unremarkable. Normal flow voids. ORBITS: Right ocular prosthesis noted. SINUSES AND MASTOIDS: No significant abnormality. BONES AND SOFT TISSUES: Normal marrow signal. Degenerative changes in the visualized upper cervical spine. No soft tissue abnormality. IMPRESSION: 1. No evidence of acute infarct in the right cerebellum. 2. No acute findings. Electronically signed by: Donnice Mania MD 08/11/2024 02:46 PM EST RP Workstation: [id number]   CT HEAD WO CONTRAST ( ) Result Date: 08/11/2024 EXAM: CT HEAD WITHOUT CONTRAST 08/11/2024 12:19:46 PM TECHNIQUE: CT of the head was performed without the administration of intravenous contrast. Automated exposure control, iterative reconstruction, and/or weight based adjustment of the mA/kV was utilized to reduce the radiation dose to as low as reasonably achievable. COMPARISON: CT head 01/13/2022 and MRI brain 01/13/2022. CLINICAL HISTORY: Neuro deficit, acute, stroke suspected;  dizziness, hx CVA. Acute neurological deficit; suspected stroke; dizziness; history of cerebrovascular accident. FINDINGS: BRAIN AND VENTRICLES: No acute hemorrhage. There is an age-indeterminate right inferior cerebellar infarction (series 8, image 31). There is mild scattered white matter hypodensities which are nonspecific but most commonly represent chronic microvascular ischemic changes. No hydrocephalus. No extra-axial collection. No mass effect or midline shift. ORBITS: Right globe prosthesis. SINUSES: No acute abnormality. SOFT TISSUES AND SKULL: No acute soft tissue abnormality. No skull fracture. IMPRESSION: 1. No acute intracranial hemorrhage or territorial infarction. 2. 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 MRI brain would be useful to further assess. Electronically signed by: Prentice Spade MD 08/11/2024 12:40 PM EST RP Workstation: [id number]   DG Chest 2 View Result Date: 08/10/2024 CLINICAL DATA:  Shortness of breath. EXAM: CHEST - 2 VIEW COMPARISON:  Radiograph and CT 08/03/2024 FINDINGS: Chronic hyperinflation with bronchial thickening. No acute airspace disease. Pulmonary nodules on CT are not well demonstrated by radiograph. No pulmonary edema, pleural effusion, or pneumothorax. Stable osseous structures IMPRESSION: Chronic hyperinflation and bronchial thickening. No acute findings. Known pulmonary nodules are not well seen by radiograph Electronically Signed   By: Andrea Gasman M.D.   On: 08/10/2024 18:16   CT CHEST W CONTRAST Result Date: 08/03/2024 EXAM: CT CHEST WITH CONTRAST 08/03/2024 07:10:56 PM TECHNIQUE: CT of the chest was performed with the  administration of 80 mL of iohexol  (OMNIPAQUE ) 300 MG/ML solution. Multiplanar reformatted images are provided for review. Automated exposure control, iterative reconstruction, and/or weight based adjustment of the mA/kV was utilized to reduce the radiation dose to as low as reasonably achievable. COMPARISON: Chest  radiograph 08/03/2024 and CT chest 01/13/2022. CLINICAL HISTORY: Abnormal X-ray showing lung nodule, greater than or equal to 1 cm. Shortness of breath for 3 days. FINDINGS: MEDIASTINUM: Heart and pericardium are unremarkable. Normal heart size. No pericardial effusions. Normal caliber thoracic aorta. The central airways are clear. The thyroid gland is unremarkable. The esophagus is decompressed. LYMPH NODES: No mediastinal, hilar or axillary lymphadenopathy. LUNGS AND PLEURA: Circumscribed right upper lobe solid pulmonary nodule measuring 10 mm in diameter. Series 4 image 30. No change in size or appearance since the previous study. Left upper lobe medial lung nodule measuring 0.5 x 1.8 cm diameter is also unchanged since prior study. No airspace disease or consolidation in the lungs. Scattered diffuse emphysematous changes throughout the lungs. Scarring in the lung bases. No pleural effusion or pneumothorax. Bronchial wall thickening consistent with chronic bronchitis. SOFT TISSUES/BONES: No acute abnormality of the bones or soft tissues. No acute bony abnormalities or focal bone lesions. UPPER ABDOMEN: Limited images of the upper abdomen demonstrates no acute abnormality. No acute abnormalities demonstrated in the visualized upper abdomen. IMPRESSION: 1. No acute intrathoracic abnormality to explain shortness of breath. 2. Scattered diffuse emphysematous changes throughout the lungs, which is an independent risk factor for lung cancer and warrants consideration for evaluation for a low-dose CT lung cancer screening program. 3. Bronchial wall thickening consistent with chronic bronchitis. 4. Stable circumscribed right upper lobe solid pulmonary nodule measuring 10 mm in diameter. 5. Stable left upper lobe medial lung nodule measuring 0.5 by 1.8 centimeters in diameter. Electronically signed by: Elsie Gravely MD 08/03/2024 07:37 PM EST RP Workstation: [id number]   DG Chest Port 1 View Result Date:  08/03/2024 CLINICAL DATA:  sob, wheezing EXAM: PORTABLE CHEST - 1 VIEW COMPARISON:  01/19/2022 FINDINGS: Cardiomediastinal silhouette and pulmonary vasculature are within normal limits. Known RIGHT upper lobe pulmonary nodule is better seen on prior CT from 01/13/2022. Lungs are otherwise clear. IMPRESSION: 1. No acute cardiopulmonary process. 2. Nonemergent contrast enhanced chest CT should be performed for follow-up of previously seen RIGHT upper lobe pulmonary nodule best visualized on CT angiography of the chest from 01/13/2022. Electronically Signed   By: Aliene Lloyd M.D.   On: 08/03/2024 11:21    Microbiology: Results for orders placed or performed during the hospital encounter of 08/03/24  Resp panel by RT-PCR (RSV, Flu A&B, Covid) Anterior Nasal Swab     Status: None   Collection Time: 08/03/24 10:41 AM   Specimen: Anterior Nasal Swab  Result Value Ref Range Status   SARS Coronavirus 2 by RT PCR NEGATIVE NEGATIVE Final    Comment: (NOTE) SARS-CoV-2 target nucleic acids are NOT DETECTED.  The SARS-CoV-2 RNA is generally detectable in upper respiratory specimens during the acute phase of infection. The lowest concentration of SARS-CoV-2 viral copies this assay can detect is 138 copies/mL. A negative result does not preclude SARS-Cov-2 infection and should not be used as the sole basis for treatment or other patient management decisions. A negative result may occur with  improper specimen collection/handling, submission of specimen other than nasopharyngeal swab, presence of viral mutation(s) within the areas targeted by this assay, and inadequate number of viral copies(<138 copies/mL). A negative result must be combined with clinical observations, patient history, and  epidemiological information. The expected result is Negative.  Fact Sheet for Patients:  bloggercourse.com  Fact Sheet for Healthcare Providers:   seriousbroker.it  This test is no t yet approved or cleared by the [place]  FDA and  has been authorized for detection and/or diagnosis of SARS-CoV-2 by FDA under an Emergency Use Authorization (EUA). This EUA will remain  in effect (meaning this test can be used) for the duration of the COVID-19 declaration under Section 564(b)(1) of the Act, 21 U.S.C.section 360bbb-3(b)(1), unless the authorization is terminated  or revoked sooner.       Influenza A by PCR NEGATIVE NEGATIVE Final   Influenza B by PCR NEGATIVE NEGATIVE Final    Comment: (NOTE) The Xpert Xpress SARS-CoV-2/FLU/RSV plus assay is intended as an aid in the diagnosis of influenza from Nasopharyngeal swab specimens and should not be used as a sole basis for treatment. Nasal washings and aspirates are unacceptable for Xpert Xpress SARS-CoV-2/FLU/RSV testing.  Fact Sheet for Patients: bloggercourse.com  Fact Sheet for Healthcare Providers: seriousbroker.it  This test is not yet approved or cleared by the [place]  FDA and has been authorized for detection and/or diagnosis of SARS-CoV-2 by FDA under an Emergency Use Authorization (EUA). This EUA will remain in effect (meaning this test can be used) for the duration of the COVID-19 declaration under Section 564(b)(1) of the Act, 21 U.S.C. section 360bbb-3(b)(1), unless the authorization is terminated or revoked.     Resp Syncytial Virus by PCR NEGATIVE NEGATIVE Final    Comment: (NOTE) Fact Sheet for Patients: bloggercourse.com  Fact Sheet for Healthcare Providers: seriousbroker.it  This test is not yet approved or cleared by the [place]  FDA and has been authorized for detection and/or diagnosis of SARS-CoV-2 by FDA under an Emergency Use Authorization (EUA). This EUA will remain in effect (meaning this test can be used) for  the duration of the COVID-19 declaration under Section 564(b)(1) of the Act, 21 U.S.C. section 360bbb-3(b)(1), unless the authorization is terminated or revoked.  Performed at [facility], [address] [place], [place] [address]     Labs: CBC: Recent Labs  Lab 08/10/24 1742  WBC 7.0  HGB 15.7  HCT 47.1  MCV 87.9  PLT 291   Basic Metabolic Panel: Recent Labs  Lab 08/10/24 1742  NA 141  K 3.8  CL 103  CO2 23  GLUCOSE 90  BUN 16  CREATININE 0.99  CALCIUM  8.9   Liver Function Tests: No results for input(s): AST, ALT, ALKPHOS, BILITOT, PROT, ALBUMIN in the last 168 hours. CBG: No results for input(s): GLUCAP in the last 168 hours.  Discharge time spent: greater than 30 minutes.  Signed: Bernardino KATHEE Come, MD [facility] 08/12/2024 "
